# Patient Record
Sex: Female | Born: 1950 | Race: White | Hispanic: No | Smoking: Former smoker
Health system: Southern US, Community
[De-identification: ages and names within clinical notes are randomized; demographics above are authoritative.]

## PROBLEM LIST (undated history)

## (undated) DIAGNOSIS — H9190 Unspecified hearing loss, unspecified ear: Secondary | ICD-10-CM

## (undated) DIAGNOSIS — Z87442 Personal history of urinary calculi: Secondary | ICD-10-CM

## (undated) DIAGNOSIS — M797 Fibromyalgia: Secondary | ICD-10-CM

## (undated) DIAGNOSIS — U071 COVID-19: Secondary | ICD-10-CM

## (undated) DIAGNOSIS — I2510 Atherosclerotic heart disease of native coronary artery without angina pectoris: Secondary | ICD-10-CM

## (undated) DIAGNOSIS — M1990 Unspecified osteoarthritis, unspecified site: Secondary | ICD-10-CM

## (undated) DIAGNOSIS — J449 Chronic obstructive pulmonary disease, unspecified: Secondary | ICD-10-CM

## (undated) DIAGNOSIS — H539 Unspecified visual disturbance: Secondary | ICD-10-CM

## (undated) DIAGNOSIS — I214 Non-ST elevation (NSTEMI) myocardial infarction: Secondary | ICD-10-CM

## (undated) DIAGNOSIS — F419 Anxiety disorder, unspecified: Secondary | ICD-10-CM

## (undated) DIAGNOSIS — R0600 Dyspnea, unspecified: Secondary | ICD-10-CM

## (undated) DIAGNOSIS — I10 Essential (primary) hypertension: Secondary | ICD-10-CM

## (undated) DIAGNOSIS — Z8719 Personal history of other diseases of the digestive system: Secondary | ICD-10-CM

## (undated) DIAGNOSIS — E785 Hyperlipidemia, unspecified: Secondary | ICD-10-CM

## (undated) DIAGNOSIS — F32A Depression, unspecified: Secondary | ICD-10-CM

## (undated) HISTORY — DX: Atherosclerotic heart disease of native coronary artery without angina pectoris: I25.10

## (undated) HISTORY — PX: CHOLECYSTECTOMY: SHX55

## (undated) HISTORY — DX: Non-ST elevation (NSTEMI) myocardial infarction: I21.4

## (undated) HISTORY — DX: Hyperlipidemia, unspecified: E78.5

## (undated) HISTORY — PX: KNEE ARTHROSCOPY: SUR90

## (undated) HISTORY — PX: ABDOMINAL HYSTERECTOMY: SHX81

## (undated) HISTORY — DX: Unspecified hearing loss, unspecified ear: H91.90

## (undated) HISTORY — DX: Essential (primary) hypertension: I10

## (undated) HISTORY — PX: BACK SURGERY: SHX140

## (undated) HISTORY — PX: TUBAL LIGATION: SHX77

## (undated) HISTORY — PX: COLONOSCOPY: SHX174

## (undated) HISTORY — DX: Unspecified visual disturbance: H53.9

## (undated) HISTORY — PX: BREAST LUMPECTOMY: SHX2

---

## 1997-12-06 ENCOUNTER — Inpatient Hospital Stay (HOSPITAL_COMMUNITY): Admission: EM | Admit: 1997-12-06 | Discharge: 1997-12-08 | Payer: Self-pay | Admitting: Cardiology

## 1998-01-12 ENCOUNTER — Encounter: Payer: Self-pay | Admitting: Cardiology

## 2000-04-16 ENCOUNTER — Inpatient Hospital Stay (HOSPITAL_COMMUNITY): Admission: EM | Admit: 2000-04-16 | Discharge: 2000-04-18 | Payer: Self-pay | Admitting: Cardiology

## 2000-04-17 ENCOUNTER — Encounter: Payer: Self-pay | Admitting: Cardiology

## 2000-04-18 ENCOUNTER — Encounter: Payer: Self-pay | Admitting: Cardiology

## 2002-08-28 ENCOUNTER — Encounter: Payer: Self-pay | Admitting: Cardiology

## 2004-03-17 ENCOUNTER — Encounter: Payer: Self-pay | Admitting: Cardiology

## 2004-09-07 ENCOUNTER — Encounter: Payer: Self-pay | Admitting: Cardiology

## 2008-01-20 ENCOUNTER — Encounter: Payer: Self-pay | Admitting: Cardiology

## 2008-12-22 ENCOUNTER — Encounter: Payer: Self-pay | Admitting: Cardiology

## 2008-12-24 ENCOUNTER — Encounter: Payer: Self-pay | Admitting: Cardiology

## 2009-09-28 ENCOUNTER — Encounter: Payer: Self-pay | Admitting: Cardiology

## 2010-06-07 ENCOUNTER — Ambulatory Visit: Payer: Self-pay | Admitting: Cardiology

## 2010-06-07 DIAGNOSIS — E669 Obesity, unspecified: Secondary | ICD-10-CM

## 2010-06-07 DIAGNOSIS — F17200 Nicotine dependence, unspecified, uncomplicated: Secondary | ICD-10-CM | POA: Insufficient documentation

## 2010-06-07 DIAGNOSIS — E782 Mixed hyperlipidemia: Secondary | ICD-10-CM

## 2010-06-07 DIAGNOSIS — I2510 Atherosclerotic heart disease of native coronary artery without angina pectoris: Secondary | ICD-10-CM | POA: Insufficient documentation

## 2010-06-07 DIAGNOSIS — I2129 ST elevation (STEMI) myocardial infarction involving other sites: Secondary | ICD-10-CM

## 2010-08-23 NOTE — Letter (Signed)
Summary: [facility] -NOTE  [facility] -NOTE   Imported By: Claudette Laws 11/18/2009 13:18:11  _____________________________________________________________________  External Attachment:    Type:   Image     Comment:   External Document

## 2010-08-23 NOTE — Cardiovascular Report (Signed)
Summary: Cardiac Cath Other  Cardiac Cath Other   Imported By: Claudette Laws 11/18/2009 13:19:26  _____________________________________________________________________  External Attachment:    Type:   Image     Comment:   External Document

## 2010-08-23 NOTE — Assessment & Plan Note (Signed)
Summary: np6 transferring records -vs   Visit Type:  Initial Consult Primary Provider:  Linna Darner   History of Present Illness: the patient had a stress test in 2010 which was within normal limits. Chest prior history of PCI and stent placement her ejection fraction 65%. She had a stent obtuse marginal branch in May of 1999. She is now establish as a new patient transferring her care from [facility]. Now working again.  Lost a lot of weight. No scp. No sob. Pin needle feeling. See Hasanaj-bloodwork.  Preventive Screening-Counseling & Management  Alcohol-Tobacco     Smoking Status: current     Smoking Cessation Counseling: yes     Packs/Day: 1/2 PPD  Current Medications (verified): 1)  Prilosec Otc 20 Mg Tbec (Omeprazole Magnesium) .... Take 1 Tablet By Mouth Once A Day 2)  Metoprolol Tartrate 50 Mg Tabs (Metoprolol Tartrate) .... Take 1 Tablet By Mouth Once A Day At Bedtime 3)  Triglide 160 Mg Tabs (Fenofibrate) .... Take 1 Tablet By Mouth Once A Day 4)  Aspir-Low 81 Mg Tbec (Aspirin) .... Take 1 Tablet By Mouth Once A Day 5)  Fosamax 70 Mg Tabs (Alendronate Sodium) .... Take One By Mouth Weekly 6)  Lisinopril-Hydrochlorothiazide 10-12.5 Mg Tabs (Lisinopril-Hydrochlorothiazide) .... Take 2 Tablet By Mouth Once A Day 7)  Drisdol 16109 Unit Caps (Ergocalciferol) .... Take One By Mouth Weekly 8)  Meloxicam 15 Mg Tabs (Meloxicam) .... Take 1 Tablet By Mouth Once A Day As Needed 9)  Metamucil 0.52 Gm Caps (Psyllium) .... Take 1 Tablet By Mouth Once A Day 10)  Astepro 0.15 % Soln (Azelastine Hcl) .... 2 Sprays/nostrils Daily As Needed  Allergies (verified): No Known Drug Allergies  Comments:  Nurse/Medical Assistant: The patient's medication bottles and allergies were reviewed with the patient and were updated in the Medication and Allergy Lists.  Past History:  Past Surgical History: Last updated: 16-Jun-2010 Cardiac Catheterization  Family History: Last  updated: 06/16/2010 Father deceased Mother alive  Social History: Last updated: 2010-06-16 Married   Risk Factors: Smoking Status: current (06-16-2010) Packs/Day: 1/2 PPD (06-16-2010)  Past Medical History: HYPERLIPIDEMIA CHEST PAIN UNSPECIFIED  ACUT MYOCARD INFARCT OTH LAT WALL INIT EPIS CARE OBESITY, UNSPECIFIED NONDEPENDENT TOBACCO USE DISORDER coronary artery disease-post angioplasty to the obtuse marginal branch in May 1999 stress test December 22, 2008 breast attenuation artifact otherwise low risk with no ischemia ejection fraction 67% no EKG changes. Tobacco use  Social History: Smoking Status:  current Packs/Day:  1/2 PPD  Vital Signs:  Patient profile:   60 year old female Height:      69 inches Weight:      241 pounds BMI:     35.72 Pulse rate:   86 / minute BP sitting:   97 / 66  (left arm) Cuff size:   large  Vitals Entered By: Carlye Grippe (2010/06/16 3:06 PM)  Nutrition Counseling: Patient's BMI is greater than 25 and therefore counseled on weight management options.  Physical Exam  Additional Exam:  General: Well-developed, well-nourished in no distress head: Normocephalic and atraumatic eyes PERRLA/EOMI intact, conjunctiva and lids normal nose: No deformity or lesions mouth normal dentition, normal posterior pharynx neck: Supple, no JVD.  No masses, thyromegaly or abnormal cervical nodes lungs: Normal breath sounds bilaterally without wheezing.  Normal percussion heart: regular rate and rhythm with normal S1 and S2, no S3 or S4.  PMI is normal.  No pathological murmurs abdomen: Normal bowel sounds, abdomen is soft and nontender  without masses, organomegaly or hernias noted.  No hepatosplenomegaly musculoskeletal: Back normal, normal gait muscle strength and tone normal pulsus: Pulse is normal in all 4 extremities Extremities: No peripheral pitting edema neurologic: Alert and oriented x 3 skin: Intact without lesions or rashes cervical  nodes: No significant adenopathy psychologic: Normal affect    EKG  Procedure date:  07/06/2010  Findings:      EKG normal sinus rhythm no acute ischemic changes  Impression & Recommendations:  Problem # 1:  HYPERLIPIDEMIA (ICD-272.4) continue medical therapy follow the panel for primary care physician Her updated medication list for this problem includes:    Triglide 160 Mg Tabs (Fenofibrate) .Marland Kitchen... Take 1 tablet by mouth once a day  Problem # 2:  ACUT MYOCARD INFARCT OTH LAT WALL INIT EPIS CARE (ICD-410.51) no recurrent chest pain.  Continue medical therapy status post prior angioplasty Her updated medication list for this problem includes:    Metoprolol Tartrate 50 Mg Tabs (Metoprolol tartrate) .Marland Kitchen... Take 1 tablet by mouth once a day at bedtime    Aspir-low 81 Mg Tbec (Aspirin) .Marland Kitchen... Take 1 tablet by mouth once a day    Lisinopril-hydrochlorothiazide 10-12.5 Mg Tabs (Lisinopril-hydrochlorothiazide) .Marland Kitchen... Take 2 tablet by mouth once a day  Problem # 3:  OBESITY, UNSPECIFIED (ICD-278.00) weight loss.  Patient is dieting  Problem # 4:  NONDEPENDENT TOBACCO USE DISORDER (ICD-305.1) I counseled the patient regarding her tobacco use.  Patient Instructions: 1)  Your physician recommends that you continue on your current medications as directed. Please refer to the Current Medication list given to you today. 2)  Follow up in  6 months

## 2010-08-23 NOTE — Letter (Signed)
Summary: [facility] -OFFICE NOTE  [facility] -OFFICE NOTE   Imported By: Claudette Laws 11/18/2009 10:35:12  _____________________________________________________________________  External Attachment:    Type:   Image     Comment:   External Document

## 2010-12-09 NOTE — Discharge Summary (Signed)
[place]  Patient:    Audrey Craig, Audrey Craig                        MRN: [id number] Adm. Date:  [id number] Disc. Date: [id number] Attending:  Ophelia Shoulder Dictator:   [facility] [place], [place].A.                           Discharge Summary  ADMISSION DIAGNOSES:  1. Chest pain/not otherwise specified.  2. History of coronary artery disease with history of angioplasty.  3. 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  4. Hyperlipidemia.  5. Postmenopausal.  6. History of back surgery.  7. History of hysterectomy.  8. History of cholecystectomy.  9. History of lumpectomy that was benign.  DISCHARGE DIAGNOSES:  1. Chest/pain not otherwise specified.  2. History of coronary artery disease with history of angioplasty.  3. 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  4. Hyperlipidemia.  5. Postmenopausal.  6. History of back surgery.  7. History of hysterectomy.  8. History of cholecystectomy.  9. History of lumpectomy that was benign. 10. Status post cardiac catheterization April 18, 2000 with patent     coronary arteries on this catheterization. 11. Status post adenosine-cardiolite April 17, 2000 that revealed positive     ischemia in the anterior and apex region.  HISTORY OF PRESENT ILLNESS:  [name] is a 60 year old white female with a history of subendocardial myocardial infarction with one vessel CAD at the time of catheterization in May 1999.  She underwent PTCA stent to the circumflex at that time.  As well she has a history of hyperlipidemia and occasional tobacco use.  She presented to [facility] on April 16, 2000 as a transfer from [facility] with complaints of chest pain.  She stated that on the previous day while getting ready for church she developed chest pain in the middle of her chest "like somebody stepping on me."  It was a "pressure."  There was no radiation of the pain, no  shortness of breath, and no diaphoresis.  She stated that it lasted approximately 15 minutes and spontaneously resolved on its own.  She went to church and went on with her daily activities.  After church they went to a restaurant for lunch.  She had just taken a few sips of water when she developed chest pain. It was a pressure and squeezing sensation across her chest.  She did develop associated shortness of breath, diaphoresis, and radiation to the back.  As well she had nausea but no emesis.  At that time she got up and went to the bathroom and put a cold rag on her face and loosened her bra as it felt as if this was too tight.  She then went back and her family took her to the emergency room of [facility].  She was given oxygen and nitroglycerin with relief after approximately 15 to 30 minutes.  She had no return of the symptoms since.  On exam at that time, her blood pressure was 129/80, heart rate 82, and her exam essentially benign.  EKG from [facility] had shown normal sinus rhythm 78 beats per minute.  She did have some new T wave inversions in lead 3, nonspecific change in aVF that was new since EKG of April 29, 1999, but these were nonspecific changes.  As well, initial cardiac enzymes from [facility] had  been negative.  At that time, Ms. Tackitt was seen by Dr. Lavonne Chick.  We planned to continue home medications including aspirin and Zocor.  We would give nitroglycerin 0.4 mg sublingual as needed for chest pain.  She was planned for adenosine-cardiolite the following morning.  HOSPITAL COURSE:  On the morning of April 17, 2000, Ms. Heney was hemodynamically stable with no further chest pain.  Enzymes continued to be negative and exam remained benign.  She was planned for cardiolite that day. Later that day, she underwent adenosine-cardiolite.  During infusion patient had complaints of shortness of breath, flushing, and complaints of pressure in her chest.   This resolved two minutes after the test was completed.  EKG showed no significant arrhythmias or ST changes.  There continued to be poor R wave progression which was then changed from baseline.  The cardiolite images ended up revealing positive ischemia in the anterior and apex.  Therefore, she was planned for cardiac catheterization with Dr. Jenne Campus on April 18, 2000.  On April 18, 2000, Ms. Hirschhorn underwent cardiac catheterization by Dr. Lenise Herald.  She was found to have spasm in the proximal RCA. Otherwise, all coronaries were patent.  All previous stent sites, PCI sites were patent.  He found no significant CAD.  LV gram showed EF 60%.  She had no renal artery stenosis and her distal aorta was okay.  She was planned for medical therapy and for discharge home after bed rest if her groin remained stable.  HOSPITAL CONSULTATIONS:  None.  HOSPITAL PROCEDURES: 1. Adenosine-cardiolite on April 17, 2000 that revealed positive    ischemia in the anterior and apex regions. 2. Status post cardiac catheterization by Dr. Lenise Herald on April 18, 2000 revealing normal coronary arteries and normal LV function.  HOSPITAL LABORATORIES:  INR was 1.0 at admission.  CBC showed WBC 9.3, hemoglobin 14.3, hematocrit 40.1, platelets 201.  Metabolic profile showed sodium 138, potassium 3.8, glucose 81, BUN 11, creatinine 0.8.  Cardiac enzymes were negative with CKs of 57, 50, and 50.  MBs were 0.4 and 0.3. Troponin of less than 0.03 x 2.  Magnesium normal at 2.1.  TSH normal at 1.153.  Lipid profile shows cholesterol 196, triglyceride 255, HDL 31, LDL 114.  Cardiolite on April 17, 2000 revealed: (1) Ischemia involving the anterior apical/aspect of the ______ wall.  (2) ______ attenuation artifact.  (3) No wall motion abnormality.  (4) Normal LV function with EF 65%.  EKG on admission revealed a normal sinus rhythm 78 beats per minute.  There was T wave inversion in lead  3 and nonspecific ST changes in aVF which were different from EKG of April 28, 1999 but were nonspecific changes.  DISCHARGE MEDICATIONS:  1. Enteric-coated aspirin 325 mg one p.o. q.d. 2. Zocor 80 mg one p.o. q.h.s. 3. Premarin 0.625 mg one p.o. q.d. 4. Calcium + 600 mg one p.o. q.d. 5. Vitamin E 400 international units one q.d. 6. Centrum Silver one q.d.  DISCHARGE ACTIVITY:  No strenuous activity, lifting greater than five pounds, driving, or sexual activity for three days.  DIET:  Low-salt, low-fat diet.  WOUND CARE:  Patient may wash her groin with warm water and soap.  DISCHARGE INSTRUCTIONS:  Call the office at [phone] if any bleeding or increased signs of pain in the groin.  FOLLOW-UP:  In approximately two months, she needs to make sure that she has labs drawn to check her fasting blood profile and liver function tests  to follow up her increased Lipitor/Zocor dose.  She has an appointment to follow up in the [place] office with Dr. Elsie Lincoln on May 16, 2000 at 10:15 a.m. DD:  05/11/00 TD:  05/12/00 Job: [id number]

## 2010-12-09 NOTE — Discharge Summary (Signed)
[place]. [facility]  Patient:    Audrey, Craig                        MRN: [id number] Adm. Date:  [id number] Disc. Date: 04/18/00 Attending:  Ophelia Shoulder Dictator:   [facility] [place], [place].A.                           Discharge Summary  ADMISSION DIAGNOSES:  1. Unstable angina.  2. 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  3. Status post cardiac catheterization May 1999 revealing one-vessel coronary     artery disease with percutaneous transluminal coronary angioplasty stent     to the circumflex at that time.  All other coronaries were patent and     normal at that time of catheterization.  4. Normal left ventricular function at time of catheterization in May 1999.  5. Hyperlipidemia.  6. Postmenopausal.  7. History of back surgery.  8. History of hysterectomy.  9. History of cholecystectomy. 10. History of lumpectomy that was benign.  DISCHARGE DIAGNOSES:  1. Unstable angina.  2. 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  3. Status post cardiac catheterization May 1999 revealing one-vessel coronary     artery disease with percutaneous transluminal coronary angioplasty stent     to the circumflex at that time.  All other coronaries were patent and     normal at that time of catheterization.  4. Normal left ventricular function at time of catheterization in May 1999.  5. Hyperlipidemia.  6. Postmenopausal.  7. History of back surgery.  8. History of hysterectomy.  9. History of cholecystectomy. 10. History of lumpectomy that was benign. 11. Status post cardiac catheterization on April 18, 2000 with patent     coronary arteries, normal left ventricular function, ejection fraction     60%.  No renal artery stenosis.  HISTORY OF PRESENT ILLNESS:  Audrey Craig is a 60 year old white female with a history of subendocardial myocardial infarction with one-vessel CAD at time of catheterization in May 1999.  She  underwent PTCA stent of the circumflex at that time.  As well, she has a history of hyperlipidemia and occasional tobacco use.  She presented on April 16, 2000 as a transfer from [facility] with complaints of chest pain.  She states that on the previous day, while getting ready for church, she developed chest pain in the middle of her chest "like somebody stepping on me."  It was a "pressure."  There was no radiation of the pain, no shortness of breath, and no diaphoresis.  She states that it lasted approximately 15 minutes then spontaneously resolved on its own.  She went to church and went on with her daily activities.  After church, they went to a restaurant for lunch.  She had just taken few sips of water when she developed chest pain.  It was a pressure and squeezing sensation across her chest.  She did develop associated shortness of breath, diaphoresis, and radiation to the back.  As well, she had nausea but no emesis.  At that time, she got up and went to the bathroom and put a cold rag on her face and loosened her bra as it felt as if this was too tight.  She then went back and her family took her to the emergency room of [facility].  She was given oxygen and nitroglycerin with relief after  approximately 15-30 minutes.  She had had no return of her symptoms since.  On exam at that time, her blood pressure was 129/80, heart rate 82, and her exam essentially benign.  EKG from [facility] showed normal sinus rhythm 78 beats per minute.  She did have some new T wave inversions in lead 3 and nonspecific change in aVF that was new since EKG of April 28, 1999, though these were nonspecific changes.  As well, initial cardiac enzymes from [facility] have been negative.  At that time, Audrey Craig was seen by Dr. Lavonne Chick.  We planned to continue home medications including aspirin and Zocor.  We would give nitroglycerin 0.4 mg sublingual as needed for chest pain.   She was planned for adenosine- cardiolite the following morning.  HOSPITAL COURSE:  On the morning of April 17, 2000, Audrey Craig was hemodynamically stable with no further chest pain.  Enzymes continued to be negative and exam remained benign.  She was planned for cardiolite that day. Later that day, she underwent adenosine-cardiolite.  During infusion, patient had complaints of shortness of breath, flushing, and complaints of pressure in her chest.  This resolved two minutes after the test was completed.  EKG showed no significant arrhythmias or ST changes.  There continued to be poor R-wave progression which was unchanged from baseline.  The cardiolite images ended up revealing positive ischemia in the anterior and apex.  Therefore, she was then planned for cardiac catheterization with Dr. Jenne Campus on April 18, 2000.  On April 18, 2000, Audrey Craig underwent cardiac catheterization by Dr. Lenise Herald.  She was found to have some spasm in the proximal RCA. Otherwise, all coronaries were patent.  All previous stent sites, PCI site was patent.  He found no significant CAV.  LV gram showed EF 60%.  She had no renal artery stenosis and her distal aorta was okay.  She was planned for medical therapy and for discharge home after bed rest if her groin remained stable.  HOSPITAL CONSULTATIONS:  None.  HOSPITAL PROCEDURES: 1. Adenosine-cardiolite on April 17, 2000 that revealed positive ischemia    in the anterior and apex regions. 2. Status post cardiac catheterization by Dr. Lenise Herald on    April 18, 2000 revealing normal coronary arteries and normal LV    function.  HOSPITAL LABORATORIES:  INR was 1.0.  CBC showed WBC 9.3, hemoglobin 14.3, hematocrit 40.1, platelets 201.  Metabolic profile showed sodium 138, potassium 3.8, glucose 81, BUN 11, creatinine 0.8.  Cardiac enzymes were negative with CKs of 57 and 50, MB 0.4/0.3, troponin less than 0.03 x 2. Magnesium  normal at 2.1.  TSH normal at 1.153.  Lipid profile revealed Total cholesterol 196, triglycerides 255, HDL 31, LDL 114.   Cardiolite on April 17, 2000 revealed:  (1) Ischemia involving the interior apical/aspect of the ______ wall.  (2) Breast attenuation artifact. (3) No wall motion abnormality.  (4) Normal LV function, EF 65%.  EKG on admission revealed a normal sinus rhythm 78 beats per minute.  There was T wave inversion in lead 3 and nonspecific ST changes in aVF which were different from EKG of April 28, 1999 but were nonspecific changes.  DISCHARGE MEDICATIONS: 1. Enteric-coated aspirin 325 mg one p.o. q.d. 2. Zocor 80 mg one p.o. q.h.s. (this was increased from her previous dose of    40 mg a day due to her dyslipidemia). 3. Premarin 0.625 mg one p.o. q.d. 4. Calcium Plus 600 mg one q.d. 5.  Vitamin E 400 international units one q.d. 6. Centrum Silver one q.d.  DISCHARGE ACTIVITIES:  No strenuous activity, lifting greater than five pounds, driving, or sexual activity for three days.  DIET:  Low-salt, low-fat diet.  WOUND CARE:  Patient may wash her groin with warm water and soap.  Call the office at [phone] if any bleeding or increased signs of pain in her groin.  FOLLOW-UP:  In approximately two months, she needs to make sure she has labs drawn to check a fasting lipid profile and liver function tests for follow up of increased Zocor dose.  She has an appointment to follow up in the [place] office with Dr. Elsie Lincoln on May 16, 2000 at 10:15 a.m. DD:  04/18/00 TD:  04/19/00 Job: [id number]

## 2010-12-09 NOTE — Cardiovascular Report (Signed)
[place]. [facility]  Patient:    Audrey Craig, Audrey Craig                        MRN: [id number] Proc. Date: 04/18/00 Adm. Date:  [id number] Attending:  Ophelia Shoulder CC:         Madaline Savage, M.D.   Cardiac Catheterization  PROCEDURE: 1. Left heart catheterization. 2. Coronary angiography. 3. Left ventriculogram. 4. Descending aortogram.  COMPLICATIONS:  None.  INDICATIONS:  Ms. Loose is a 60 year old white female patient of Madaline Savage, M.D. with a history of CAD status post PTCA in May of 1999 of a third OM.  The patient also has a history of hyperlipidemia and tobacco abuse.  She was admitted on April 16, 2000, with substernal chest pain and subsequently ruled out for myocardial infarction.  She underwent Adenosine Cardiolite while hospitalized which revealed distal anterior apical ischemia and is now referred for cardiac catheterization to define her coronary anatomy.  DESCRIPTION OF PROCEDURE:  After giving informed written consent, the patient is brought to the cardiac catheterization and her right and left groins were shaved, prepped and draped in the usual sterile fashion.  ECG monitoring was established.  Using modified Seldinger technique, a #6 French arterial sheath was inserted in the right femoral artery.  6 French diagnostic catheter was then used to perform diagnostic angiography.  This reveals a large left main with no significant disease.  The LAD is a large vessel which coursed the apex and gave rise to two diagonal branches.  LAD had no significant disease.  The first and second diagonals are medium size vessels with no significant disease.  Left coronary artery also gives rise to medium size ramus intermedius which bifurcates in its distal portion and has no significant disease.  The left circumflex is a medium size which coursed in the AV groove and gave rise to two obtuse marginal branches.  AV groove  circumflex has no significant disease.  The first OM is a medium size vessel which bifurcates distally and has no significant disease.  The second OM is a small vessel with no significant disease.  The right coronary artery is a large vessel which is dominant and gives rise to both the PDA as well as the posterolateral branch.  The RCA is noted to have some spasm in its proximal segment which is relieved with intracoronary nitroglycerin.  There is no significant disease in the remainder of the RCA. Both the PDA as well as the posterolateral branch have no significant disease.  Left ventriculogram reveals a significant ectope with preserved EF, estimated at 60%.  Descending aortogram reveals no evidence of renal artery stenosis with normal distal aorta and iliac system.  HEMODYNAMICS:  Systemic arterial pressure 105/70, LV systemic pressure 105/10, LV EDP 14.  CONCLUSION: 1. No significant CAD. 2. Normal left ventricular systolic function. 3. No evidence of renal artery stenosis. DD:  04/18/00 TD:  04/18/00 Job: [id number]

## 2011-01-05 ENCOUNTER — Encounter: Payer: Self-pay | Admitting: Cardiology

## 2011-01-18 ENCOUNTER — Ambulatory Visit: Payer: Self-pay | Admitting: Cardiology

## 2011-02-15 ENCOUNTER — Ambulatory Visit: Payer: Self-pay | Admitting: Cardiology

## 2011-02-23 ENCOUNTER — Encounter: Payer: Self-pay | Admitting: Cardiology

## 2011-02-23 ENCOUNTER — Ambulatory Visit (INDEPENDENT_AMBULATORY_CARE_PROVIDER_SITE_OTHER): Payer: BC Managed Care – PPO | Admitting: Cardiology

## 2011-02-23 VITALS — BP 105/68 | HR 80 | Ht 69.0 in | Wt 247.0 lb

## 2011-02-23 DIAGNOSIS — I2129 ST elevation (STEMI) myocardial infarction involving other sites: Secondary | ICD-10-CM

## 2011-02-23 DIAGNOSIS — E785 Hyperlipidemia, unspecified: Secondary | ICD-10-CM

## 2011-02-23 MED ORDER — NITROGLYCERIN 0.4 MG SL SUBL: 0.4000 mg | SUBLINGUAL_TABLET | SUBLINGUAL | Status: DC | PRN

## 2011-02-23 NOTE — Patient Instructions (Signed)
Continue all current medications. Your physician wants you to follow up in: 6 months.  You will receive a reminder letter in the mail one-two months in advance.  If you don't receive a letter, please call our office to schedule the follow up appointment   

## 2011-07-03 NOTE — Assessment & Plan Note (Signed)
No recurrent scp. No ischemia work- up.

## 2011-07-03 NOTE — Assessment & Plan Note (Signed)
F/U with LMD.

## 2011-07-03 NOTE — Progress Notes (Signed)
Peyton Bottoms, MD, [facility] ABIM Board Certified in Adult Cardiovascular Medicine,Internal Medicine and Critical Care Medicine    CC: f/u CAD  HPI:  the patient had a stress test in 2010 which was within normal limits. Chest prior history of PCI and stent placement her ejection fraction 65%. She had a stent obtuse marginal branch in May of 1999. She is now establish as a new patient transferring her care from [facility]. Now working again.  Lost a lot of weight. No scp. No sob. Pin needle feeling. See Hasanaj-bloodwork.      PMH: reviewed and listed in Problem List in Electronic Records (and see below) Past Medical History  Diagnosis Date  . Hyperlipidemia   . Chest pain, unspecified   . Myocardial infarction     acute, OTH LAT WALL INIT EPIS CARE  . Tobacco use disorder     nondependent  . Obesity   . CAD (coronary artery disease)     post angioplasty to the obtuse marginal branch in May 1999, stress test December 22, 2008 breast attenuation artifact otherwise low risk with no ischemia ejection fraction 67% no EKG changes     Allergies/SH/FHX : available in Electronic Records for review  Medications: Current Outpatient Prescriptions  Medication Sig Dispense Refill  . albuterol (PROAIR HFA) 108 (90 BASE) MCG/ACT inhaler Inhale 2 puffs into the lungs every 4 (four) hours as needed.        Marland Kitchen alendronate (FOSAMAX) 70 MG tablet Take 70 mg by mouth every 7 (seven) days. Take with a full glass of water on an empty stomach.       Marland Kitchen aspirin 81 MG tablet Take 81 mg by mouth daily.       . Calcium Carbonate (CALTRATE 600) 1500 MG TABS Take 1 tablet by mouth 2 (two) times daily.        . ergocalciferol (VITAMIN D2) 50000 UNITS capsule Take 50,000 Units by mouth once a week.        . fenofibrate 160 MG tablet Take 160 mg by mouth daily.        . fish oil-omega-3 fatty acids 1000 MG capsule Take 2 g by mouth daily.        Marland Kitchen lisinopril-hydrochlorothiazide  (PRINZIDE,ZESTORETIC) 10-12.5 MG per tablet Take 1 tablet by mouth daily.       . metoprolol (LOPRESSOR) 50 MG tablet Take 50 mg by mouth 2 (two) times daily.       . Naproxen Sodium (ALEVE) 220 MG CAPS Take 2 capsules by mouth 2 (two) times daily.        Marland Kitchen omeprazole (PRILOSEC) 20 MG capsule Take 20 mg by mouth daily.        . psyllium (REGULOID) 0.52 G capsule Take 0.52 g by mouth daily.        . nitroGLYCERIN (NITROSTAT) 0.4 MG SL tablet Place 1 tablet (0.4 mg total) under the tongue every 5 (five) minutes as needed for chest pain.  25 tablet  3    ROS: No nausea or vomiting. No fever or chills.No melena or hematochezia.No bleeding.No claudication  Physical Exam: BP 105/68  Pulse 80  Ht 5\' 9"  (1.753 m)  Wt 247 lb (112.038 kg)  BMI 36.48 kg/m2 General: well- nourished. No distress HEENT: normal carotid upstroke. Normal JVP. No thyromegaly Cardiac: RRR, NL S1/S2. No pathologic murmurs Lungs: clear BS bilaterally, no wheezing. Abdomen, soft, non- tender Extremities. No edema. Normal distal pulses Skin: warm and dry Physologic ;  normal affect.    12lead ECG: Limited bedside ECHO:N/A   Assessment and Plan

## 2012-05-28 ENCOUNTER — Encounter: Payer: Self-pay | Admitting: Cardiology

## 2012-05-28 ENCOUNTER — Other Ambulatory Visit: Payer: Self-pay | Admitting: Cardiology

## 2012-05-28 ENCOUNTER — Ambulatory Visit (INDEPENDENT_AMBULATORY_CARE_PROVIDER_SITE_OTHER): Payer: BC Managed Care – PPO | Admitting: Cardiology

## 2012-05-28 ENCOUNTER — Encounter: Payer: Self-pay | Admitting: *Deleted

## 2012-05-28 ENCOUNTER — Telehealth: Payer: Self-pay | Admitting: Cardiology

## 2012-05-28 VITALS — BP 99/66 | HR 70 | Ht 69.0 in | Wt 243.1 lb

## 2012-05-28 DIAGNOSIS — I252 Old myocardial infarction: Secondary | ICD-10-CM

## 2012-05-28 DIAGNOSIS — I2510 Atherosclerotic heart disease of native coronary artery without angina pectoris: Secondary | ICD-10-CM

## 2012-05-28 DIAGNOSIS — E785 Hyperlipidemia, unspecified: Secondary | ICD-10-CM

## 2012-05-28 DIAGNOSIS — R0602 Shortness of breath: Secondary | ICD-10-CM

## 2012-05-28 MED ORDER — NITROGLYCERIN 0.4 MG SL SUBL: 0.4000 mg | SUBLINGUAL_TABLET | SUBLINGUAL | Status: AC | PRN

## 2012-05-28 NOTE — Assessment & Plan Note (Signed)
History of PTCA to the obtuse marginal in the setting of infarct back in 1999. Followup ECG is reviewed and nonspecific. She has had no ischemic evaluation over the last 3 or 4 years, and a followup Lexiscan cardiolite will be arranged on medical therapy for ischemic surveillance. If stable, anticipate followup in a year.

## 2012-05-28 NOTE — Patient Instructions (Addendum)
Your physician recommends that you schedule a follow-up appointment in: 1 year. You will receive a reminder letter in the mail in about 10 months reminding you to call and schedule your appointment. If you don't receive this letter, please contact our office. Your physician recommends that you continue on your current medications as directed. Please refer to the Current Medication list given to you today. Your physician has requested that you have a lexiscan myoview. For further information please visit [email or web address]. Please follow instruction sheet, as given.  

## 2012-05-28 NOTE — Telephone Encounter (Signed)
Pt has Audrey Craig of [place].  Per Toma Copier, [phone], JWJX#[id number], valid 11-8 to 06-14-12

## 2012-05-28 NOTE — Progress Notes (Signed)
Clinical Summary Audrey Craig is a 61 y.o.female presenting for followup. She is a former patient of Dr. Andee Craig, prefers to stay with Audrey Craig. She was last seen in August 2012.  She reports no clear cut angina symptoms. Has a variety of other complaints including anxiety with panic attacks, chronic knee and back pain, fibromyalgia with muscle spasms.  Lab work from March of this year reviewed finding BUN 24, creatinine 0.76, AST 21, ALT 25, potassium 3.5. She reports lipid followup with Audrey Craig.  She has had no followup stress testing within the last 3 or 4 years. Previous cardiac history detailed below.   No Known Allergies  Current Outpatient Prescriptions  Medication Sig Dispense Refill  . albuterol (PROAIR HFA) 108 (90 BASE) MCG/ACT inhaler Inhale 2 puffs into the lungs every 4 (four) hours as needed.        Marland Kitchen aspirin 81 MG tablet Take 81 mg by mouth daily.       . Calcium Carbonate (CALTRATE 600) 1500 MG TABS Take 1 tablet by mouth daily.       . DULoxetine (CYMBALTA) 20 MG capsule Take 20 mg by mouth 2 (two) times daily.      . ergocalciferol (VITAMIN D2) 50000 UNITS capsule Take 50,000 Units by mouth once a week.        . fish oil-omega-3 fatty acids 1000 MG capsule Take 1 g by mouth daily.       Marland Kitchen lisinopril-hydrochlorothiazide (PRINZIDE,ZESTORETIC) 10-12.5 MG per tablet Take 1 tablet by mouth daily.       . metoprolol (LOPRESSOR) 50 MG tablet Take 50 mg by mouth 2 (two) times daily.       . Multiple Vitamins-Minerals (SENIOR MULTIVITAMIN PLUS PO) Take 1 tablet by mouth daily.      Marland Kitchen omeprazole (PRILOSEC) 20 MG capsule Take 20 mg by mouth daily.        . psyllium (METAMUCIL) 0.52 G capsule Take 0.52 g by mouth daily.      Marland Kitchen acetaminophen (TYLENOL) 500 MG tablet Take 500 mg by mouth as needed.      . nitroGLYCERIN (NITROSTAT) 0.4 MG SL tablet Place 1 tablet (0.4 mg total) under the tongue every 5 (five) minutes as needed. Up to 3 doses. If no relief after 3rd dose, proceed to ED   25 tablet  3  . [DISCONTINUED] nitroGLYCERIN (NITROSTAT) 0.4 MG SL tablet Place 1 tablet (0.4 mg total) under the tongue every 5 (five) minutes as needed for chest pain.  25 tablet  3  . [DISCONTINUED] nitroGLYCERIN (NITROSTAT) 0.4 MG SL tablet Place 0.4 mg under the tongue every 5 (five) minutes as needed.        Past Medical History  Diagnosis Date  . Hyperlipidemia   . NSTEMI (non-ST elevated myocardial infarction)     1999  . Coronary atherosclerosis of native coronary artery     PCTA obtuse marginal 5/99 - previous [facility] patient    Social History Audrey Craig reports that she has quit smoking. Her smoking use included Cigarettes. She started smoking 2 days ago. She has a 20 pack-year smoking history. She has never used smokeless tobacco. Audrey Craig reports that she does not drink alcohol.  Review of Systems No palpitations or syncope. Uses cane to ambulate. No falls. No reported bleeding episodes. Otherwise negative except as outlined.  Physical Examination Filed Vitals:   05/28/12 1351  BP: 99/66  Pulse: 70   Filed Weights   05/28/12 1351  Weight: 243 lb  1.9 oz (110.279 kg)   Patient no acute distress. HEENT: Conjunctiva and lids normal, oropharynx clear. Neck: Supple, no elevated JVP or carotid bruits, no thyromegaly. Lungs: Clear to auscultation, nonlabored breathing at rest. Cardiac: Regular rate and rhythm, no S3 or significant systolic murmur, no pericardial rub. Abdomen: Soft, nontender, bowel sounds present. Extremities: Trace edema, distal pulses 2+. Skin: Warm and dry. Musculoskeletal: No kyphosis. Neuropsychiatric: Alert and oriented x3, affect grossly appropriate.   Problem List and Plan   Coronary atherosclerosis of native coronary artery History of PTCA to the obtuse marginal in the setting of infarct back in 1999. Followup ECG is reviewed and nonspecific. She has had no ischemic evaluation over the last 3 or 4 years, and a followup Lexiscan cardiolite  will be arranged on medical therapy for ischemic surveillance. If stable, anticipate followup in a year.  HYPERLIPIDEMIA Hopefully to obtain recent lab work from Audrey Craig. She states she was taken off of statin therapy previously, has been omega-3 supplements.    Audrey Craig, M.D., F.A.C.C.

## 2012-05-28 NOTE — Telephone Encounter (Signed)
lexiscan myoview scheduled for 05-31-2012

## 2012-05-28 NOTE — Assessment & Plan Note (Signed)
Hopefully to obtain recent lab work from Dr. Olena Leatherwood. She states she was taken off of statin therapy previously, has been omega-3 supplements.

## 2012-06-06 ENCOUNTER — Telehealth: Payer: Self-pay | Admitting: *Deleted

## 2012-06-06 NOTE — Telephone Encounter (Signed)
Husband informed

## 2012-06-06 NOTE — Telephone Encounter (Signed)
Message copied by Eustace Cutshaw on Thu Jun 06, 2012 10:44 AM ------      Message from: MCDOWELL, Illene Bolus      Created: Wed Jun 05, 2012  8:38 AM       Please let her know that the Cardiolite was normal, shows no evidence of ischemia or obvious evidence of progressive CAD. Continue medical therapy.

## 2014-05-26 ENCOUNTER — Ambulatory Visit (INDEPENDENT_AMBULATORY_CARE_PROVIDER_SITE_OTHER): Payer: Medicare Other | Admitting: Cardiology

## 2014-05-26 ENCOUNTER — Encounter: Payer: Self-pay | Admitting: Cardiology

## 2014-05-26 VITALS — BP 115/78 | HR 69 | Ht 69.0 in | Wt 234.0 lb

## 2014-05-26 DIAGNOSIS — E782 Mixed hyperlipidemia: Secondary | ICD-10-CM

## 2014-05-26 DIAGNOSIS — I2510 Atherosclerotic heart disease of native coronary artery without angina pectoris: Secondary | ICD-10-CM

## 2014-05-26 NOTE — Patient Instructions (Signed)

## 2014-05-26 NOTE — Assessment & Plan Note (Signed)
Currently on omega-3 supplements, requesting lab work for review.

## 2014-05-26 NOTE — Progress Notes (Signed)
Reason for visit: CAD, hyperlipidemia  Clinical Summary Ms. Briski is a 63 y.o.female last seen in November 2013. She is here with her mother today. She tells me that her husband passed away since I last saw her in clinic. She has had no angina symptoms. Has not been exercising, in fact gained about 18 pounds over the last year. We reviewed her medications, she continues on aspirin, beta blocker, ACE inhibitor,and omega-3 supplements. She reports having lab work with Dr. Sherrie Sport within the last few months, results to be requested.  Lexiscan Cardiolite in November 2013 was negative for ischemia with LVEF greater than 70%.I reviewed this with her again today.   No Known Allergies  Current Outpatient Prescriptions  Medication Sig Dispense Refill  . acetaminophen (TYLENOL) 500 MG tablet Take 500 mg by mouth as needed.    Marland Kitchen albuterol (PROAIR HFA) 108 (90 BASE) MCG/ACT inhaler Inhale 2 puffs into the lungs every 4 (four) hours as needed.      Marland Kitchen aspirin 81 MG tablet Take 81 mg by mouth daily.     . Calcium Carbonate (CALTRATE 600) 1500 MG TABS Take 1 tablet by mouth daily.     . diclofenac (VOLTAREN) 75 MG EC tablet Take 1 tablet by mouth 2 (two) times daily.    . DULoxetine (CYMBALTA) 60 MG capsule Take 1 capsule by mouth 2 (two) times daily.    . ergocalciferol (VITAMIN D2) 50000 UNITS capsule Take 50,000 Units by mouth once a week.      . fish oil-omega-3 fatty acids 1000 MG capsule Take 3 g by mouth daily.     Marland Kitchen lisinopril-hydrochlorothiazide (PRINZIDE,ZESTORETIC) 10-12.5 MG per tablet Take 1 tablet by mouth daily.     . metoprolol (LOPRESSOR) 50 MG tablet Take 50 mg by mouth 2 (two) times daily.     . Multiple Vitamins-Minerals (SENIOR MULTIVITAMIN PLUS PO) Take 1 tablet by mouth daily.    . nitroGLYCERIN (NITROSTAT) 0.4 MG SL tablet Place 1 tablet (0.4 mg total) under the tongue every 5 (five) minutes as needed. Up to 3 doses. If no relief after 3rd dose, proceed to ED 25 tablet 3  .  psyllium (METAMUCIL) 0.52 G capsule Take 0.52 g by mouth daily.     No current facility-administered medications for this visit.    Past Medical History  Diagnosis Date  . Hyperlipidemia   . NSTEMI (non-ST elevated myocardial infarction)     1999  . Coronary atherosclerosis of native coronary artery     PCTA obtuse marginal 5/99 - previous [place] patient    Social History Ms. Tritch reports that she has quit smoking. Her smoking use included Cigarettes. She started smoking about 2 years ago. She has a 20 pack-year smoking history. She has never used smokeless tobacco. Ms. Winner reports that she does not drink alcohol.  Review of Systems Complete review of systems negative except as otherwise outlined in the clinical summary. She has neuropathic sounding discomfort in her arms and right shoulder happens intermittently. Also in her right thigh area. No claudication symptoms.  Physical Examination Filed Vitals:   05/26/14 1441  BP: 115/78  Pulse: 69   Filed Weights   05/26/14 1441  Weight: 234 lb (106.142 kg)    Patient no acute distress. HEENT: Conjunctiva and lids normal, oropharynx clear. Neck: Supple, no elevated JVP or carotid bruits, no thyromegaly. Lungs: Clear to auscultation, nonlabored breathing at rest. Cardiac: Regular rate and rhythm, no S3 or significant systolic murmur, no pericardial rub.  Abdomen: Soft, nontender, bowel sounds present. Extremities: Trace edema, distal pulses 2+. Skin: Warm and dry. Musculoskeletal: No kyphosis. Neuropsychiatric: Alert and oriented x3, affect grossly appropriate.   Problem List and Plan   Coronary atherosclerosis of native coronary artery Symptomatically stable on medical therapy as outlined above. She had reassuring ischemic testing within the last 2 years. Will request recent lab work from Dr. Sherrie Sport, otherwise continue observation and annual follow-up.  Mixed hyperlipidemia Currently on omega-3 supplements, requesting  lab work for review.    Satira Sark, M.D., F.A.C.C.

## 2014-05-26 NOTE — Assessment & Plan Note (Signed)
Symptomatically stable on medical therapy as outlined above. She had reassuring ischemic testing within the last 2 years. Will request recent lab work from Dr. Sherrie Sport, otherwise continue observation and annual follow-up.

## 2014-10-13 ENCOUNTER — Ambulatory Visit (INDEPENDENT_AMBULATORY_CARE_PROVIDER_SITE_OTHER): Payer: Medicare Other | Admitting: Neurology

## 2014-10-13 ENCOUNTER — Encounter: Payer: Self-pay | Admitting: Neurology

## 2014-10-13 VITALS — BP 134/80 | HR 88 | Resp 16 | Ht 68.0 in | Wt 227.8 lb

## 2014-10-13 DIAGNOSIS — R42 Dizziness and giddiness: Secondary | ICD-10-CM | POA: Diagnosis not present

## 2014-10-13 DIAGNOSIS — R32 Unspecified urinary incontinence: Secondary | ICD-10-CM | POA: Insufficient documentation

## 2014-10-13 DIAGNOSIS — R260 Ataxic gait: Secondary | ICD-10-CM | POA: Diagnosis not present

## 2014-10-13 DIAGNOSIS — R200 Anesthesia of skin: Secondary | ICD-10-CM | POA: Diagnosis not present

## 2014-10-13 DIAGNOSIS — R29898 Other symptoms and signs involving the musculoskeletal system: Secondary | ICD-10-CM | POA: Diagnosis not present

## 2014-10-13 DIAGNOSIS — R9089 Other abnormal findings on diagnostic imaging of central nervous system: Secondary | ICD-10-CM | POA: Insufficient documentation

## 2014-10-13 DIAGNOSIS — R930 Abnormal findings on diagnostic imaging of skull and head, not elsewhere classified: Secondary | ICD-10-CM | POA: Diagnosis not present

## 2014-10-13 DIAGNOSIS — N39498 Other specified urinary incontinence: Secondary | ICD-10-CM

## 2014-10-13 MED ORDER — OXYBUTYNIN CHLORIDE 5 MG PO TABS: 5.0000 mg | ORAL_TABLET | Freq: Three times a day (TID) | ORAL | Status: DC

## 2014-10-13 NOTE — Progress Notes (Addendum)
[facility]  PATIENT: Audrey Craig DOB: 1950/11/26  REFERRING DOCTOR OR PCP:  Stoney Bang SOURCE: patient and records form PCP  _________________________________   HISTORICAL  CHIEF COMPLAINT:  Chief Complaint  Patient presents with  . Neurologic Problem    Sts. she has heard a swooshing noise in her right ear off/on for about a yr.  Sts. she had a corotid duplex and mri brain 2 weeks ago at [facility] in [place]  weeks ago but she doesn't know the results. Sts. she is ver hard of hearing, but also occasionally hears music in her head, so clearly she can sing to it. Sts. nobody else hears music when she does. Her mother sts. she has been under alot of stress since her husband passed away and her son was released from prison./fim    HISTORY OF PRESENT ILLNESS:  I had the pleasure of seeing your patient, Audrey Craig, for a neurologic consultation regarding a swooshing sensation in her ears and difficulties with balance and recent falls.     She reports three bad falls over the last 3 months.  She also has multiple stumbles that she catches herself.   She hit her head with her last fall but had no LOC (hit shoulder first, then head).    She feels she falls backwards more than forwards.  Prior to a fall, she gets a translational vertigo and then loses balance.   There is no spinning sensation rather a woozy sensation and feels like she is moving sideways or backwards.    If she looks down, she is more likely to get a trnaslational vertigo sensation.    She feels her gait is shuffling at times.  Other times she picks foot up too high and feels wobbly.    She also reports weakness and numbness in her legs.     Her bladder has been bad a couple years with urgency/frequency and has incontinence outside her home.     An MRI showed several small white matter foci and a remote small cerebellar lacune by report.    Changes were felt to be a little worse than expected  for age.      She has hearing difficulty, right worse than left.     This started 10 years ago.    She has hearing aids but they have not helped much recently.  She sometimes hears music that is not present.     She has had depression and anxiety and notes a lot of stress with husband's death last 06/17/13 and son having legal issues (recently in prison).       She has had an MI and had a stent in 1999.   She has hypertension   REVIEW OF SYSTEMS: Constitutional: No fevers, chills, sweats, or change in appetite Eyes: No visual changes, double vision, eye pain Ear, nose and throat: R > L  hearing loss, no ear pain, nasal congestion orsore throat Cardiovascular: No chest pain, palpitations.   H/o MI Respiratory: No shortness of breath at rest or with exertion.   No wheezes GastrointestinaI: No nausea, vomiting, diarrhea, abdominal pain, fecal incontinence Genitourinary: as above. Musculoskeletal: No neck pain, back pain Integumentary: No rash, pruritus, skin lesions Neurological: as above Psychiatric: No depression at this time.  No anxiety Endocrine: No palpitations, diaphoresis, change in appetite, change in weigh or increased thirst Hematologic/Lymphatic: No anemia, purpura, petechiae. Allergic/Immunologic: No itchy/runny eyes, nasal congestion, recent allergic reactions, rashes  ALLERGIES: No  Known Allergies  HOME MEDICATIONS:  Current outpatient prescriptions:  .  acetaminophen (TYLENOL) 500 MG tablet, Take 500 mg by mouth as needed., Disp: , Rfl:  .  albuterol (PROAIR HFA) 108 (90 BASE) MCG/ACT inhaler, Inhale 2 puffs into the lungs every 4 (four) hours as needed.  , Disp: , Rfl:  .  aspirin 81 MG tablet, Take 81 mg by mouth daily. , Disp: , Rfl:  .  Calcium Carbonate (CALTRATE 600) 1500 MG TABS, Take 1 tablet by mouth daily. , Disp: , Rfl:  .  diclofenac (VOLTAREN) 75 MG EC tablet, Take 1 tablet by mouth 2 (two) times daily., Disp: , Rfl:  .  DULoxetine (CYMBALTA) 60 MG  capsule, Take 1 capsule by mouth 2 (two) times daily., Disp: , Rfl:  .  ergocalciferol (VITAMIN D2) 50000 UNITS capsule, Take 50,000 Units by mouth once a week.  , Disp: , Rfl:  .  fish oil-omega-3 fatty acids 1000 MG capsule, Take 3 g by mouth daily. , Disp: , Rfl:  .  lisinopril-hydrochlorothiazide (PRINZIDE,ZESTORETIC) 10-12.5 MG per tablet, Take 1 tablet by mouth daily. , Disp: , Rfl:  .  metoprolol (LOPRESSOR) 50 MG tablet, Take 50 mg by mouth 2 (two) times daily. , Disp: , Rfl:  .  Multiple Vitamins-Minerals (SENIOR MULTIVITAMIN PLUS PO), Take 1 tablet by mouth daily., Disp: , Rfl:  .  nitroGLYCERIN (NITROSTAT) 0.4 MG SL tablet, Place 1 tablet (0.4 mg total) under the tongue every 5 (five) minutes as needed. Up to 3 doses. If no relief after 3rd dose, proceed to ED, Disp: 25 tablet, Rfl: 3 .  psyllium (METAMUCIL) 0.52 G capsule, Take 0.52 g by mouth daily., Disp: , Rfl:   PAST MEDICAL HISTORY: Past Medical History  Diagnosis Date  . Hyperlipidemia   . NSTEMI (non-ST elevated myocardial infarction)     1999  . Coronary atherosclerosis of native coronary artery     PCTA obtuse marginal 5/99 - previous [facility] patient  . Hearing loss   . Hypertension   . Vision abnormalities     PAST SURGICAL HISTORY: Past Surgical History  Procedure Laterality Date  . Breast lumpectomy    . Cholecystectomy    . Abdominal hysterectomy    . Back surgery      FAMILY HISTORY: Family History  Problem Relation Age of Onset  . COPD Father   . Heart disease Father   . Diabetes type II Mother     SOCIAL HISTORY:  History   Social History  . Marital Status: Widowed    Spouse Name: N/A  . Number of Children: N/A  . Years of Education: N/A   Occupational History  . Not on file.   Social History Main Topics  . Smoking status: Former Smoker -- 0.50 packs/day for 40 years    Types: Cigarettes    Start date: 05/26/2012  . Smokeless tobacco: Never Used     Comment: stopped x 7 months, but the  last week - has had a few.  Stopped again yesterday.  . Alcohol Use: No  . Drug Use: No  . Sexual Activity: Not on file   Other Topics Concern  . Not on file   Social History Narrative     PHYSICAL EXAM  Filed Vitals:   10/13/14 0953  BP: 134/80  Pulse: 88  Resp: 16  Height: 5\' 8"  (1.727 m)  Weight: 227 lb 12.8 oz (103.329 kg)    Body mass index is 34.64 kg/(m^2).  General: The patient is well-developed and well-nourished and in no acute distress  Eyes:  Funduscopic exam shows normal optic discs and retinal vessels.  Neck: The neck is supple, no carotid bruits are noted.  The neck is nontender.  Cardiovascular: The heart has a regular rate and rhythm with a normal S1 and S2. There were no murmurs, gallops or rubs. Lungs are clear to auscultation.  Skin: Extremities are without significant edema.  Musculoskeletal:  Back is mildly tender over piriformis muscles > paraspinal  Neurologic Exam  Mental status: The patient is alert and oriented x 3 at the time of the examination. The patient has apparent normal recent and remote memory, with an apparently normal attention span and concentration ability.   Speech is normal.  Cranial nerves: Extraocular movements are full. Pupils are equal, round, and reactive to light and accomodation.  Visual fields are full.  Facial symmetry is present. There is good facial sensation to soft touch bilaterally.Facial strength is normal.  Trapezius and sternocleidomastoid strength is normal. No dysarthria is noted.  The tongue is midline, and the patient has symmetric elevation of the soft palate. Reduced hearing, right worse than left.  Weber lateralized left.     Motor:  Muscle bulk is normal.   Tone is slightly higher in right leg than left leg.. Strength is  5 / 5 in all 4 extremities except 4+ over 5 right hip flexor.   Sensory: Sensory testing is intact to pinprick, soft touch and vibration sensation in arms.  Reduced vibration sensation  in toes relative to ankles and mildly reduced vibration sensation in ankles relative to knees..  Coordination: Cerebellar testing reveals good finger-nose-finger and reduced right heel-to-shin due to weakness.  Gait and station: She uses arms to get out of the chair. Station is normal.   Gait is mildly wide. Tandem gait is wide. Romberg is negative.   Reflexes: Deep tendon reflexes are symmetric and normal bilaterally.   Plantar responses are flexor.    DIAGNOSTIC DATA (LABS, IMAGING, TESTING) - I reviewed patient records, labs, notes, testing and imaging myself where available.    ASSESSMENT AND PLAN  Dizziness and giddiness  Numbness - Plan: MR Cervical Spine Wo Contrast  Abnormal brain MRI - Plan: MR Cervical Spine Wo Contrast  Other urinary incontinence - Plan: MR Cervical Spine Wo Contrast  Weakness of right leg - Plan: MR Cervical Spine Wo Contrast  Ataxic gait     In summary, Tressa Maldonado is a 64 year old woman has had multiple falls due to reduced balance and vertigo.   She also has worsening bladder function with incontinence.  On exam, she has mild right leg weakness with bilateral foot numbness.   Her brain MRI, by report, shows nonspecific foci that are more likely to be ischemic than demyelinating, especially since she has issues with hypertension and coronary arterial disease . I am most concerned about the possibility of a cervical spine process such as spinal stenosis or other myelopathy as that could explain her bladder, weakness and gait issues. We will check an MRI of the cervical spine to make sure that this is not occurring. If she does have significant changes we may have to refer her to neurosurgery. In the interim, I will have her start oxybutynin for her bladder I also advised her to be careful with her gait and consider a cane.  She will return in 2 months or sooner if she has new or worsening neurologic symptoms.   Morningstar Toft A.  Felecia Shelling, MD, PhD 6/54/6503,  54:65 AM Certified in Neurology, Clinical Neurophysiology, Sleep Medicine, Pain Medicine and Neuroimaging  [facility] [address] [place], [place] [address] [phone]   ADDENDUM:   I personally reviewed the MRI images from 09/25/2014.   It is normal for age with minimal small vessel ischemic changes.

## 2014-11-11 ENCOUNTER — Ambulatory Visit
Admission: RE | Admit: 2014-11-11 | Discharge: 2014-11-11 | Disposition: A | Discharge status: Home / Self Care | Payer: Self-pay | Source: Ambulatory Visit | Attending: Neurology | Admitting: Neurology

## 2014-11-11 DIAGNOSIS — R200 Anesthesia of skin: Secondary | ICD-10-CM

## 2014-11-11 DIAGNOSIS — R29898 Other symptoms and signs involving the musculoskeletal system: Secondary | ICD-10-CM

## 2014-11-11 DIAGNOSIS — R9089 Other abnormal findings on diagnostic imaging of central nervous system: Secondary | ICD-10-CM

## 2014-11-11 DIAGNOSIS — N39498 Other specified urinary incontinence: Secondary | ICD-10-CM

## 2014-11-12 ENCOUNTER — Telehealth: Payer: Self-pay | Admitting: *Deleted

## 2014-11-12 NOTE — Telephone Encounter (Signed)
Spoke with Audrey Craig, and per RAS, advised nothing noted on mri of c-spine that would affect the way she walks.  Advised some arthritic changes that may cause neck or arm pain were however noted.  She verbalized understanding of same/fim

## 2014-11-12 NOTE — Telephone Encounter (Signed)
-----   Message from Britt Bottom, MD sent at 11/12/2014 12:50 PM EDT ----- Please let her know MRI shows arthritic and disc degeneration .Marland Kitchen   Might cause some neck pain or arm pain but nothing that would affect her gait

## 2014-12-07 ENCOUNTER — Ambulatory Visit (INDEPENDENT_AMBULATORY_CARE_PROVIDER_SITE_OTHER): Payer: Medicare Other | Admitting: Neurology

## 2014-12-07 ENCOUNTER — Encounter: Payer: Self-pay | Admitting: Neurology

## 2014-12-07 VITALS — BP 114/65 | HR 52 | Ht 69.0 in | Wt 224.0 lb

## 2014-12-07 DIAGNOSIS — N3941 Urge incontinence: Secondary | ICD-10-CM

## 2014-12-07 DIAGNOSIS — R42 Dizziness and giddiness: Secondary | ICD-10-CM

## 2014-12-07 DIAGNOSIS — R260 Ataxic gait: Secondary | ICD-10-CM

## 2014-12-07 DIAGNOSIS — R200 Anesthesia of skin: Secondary | ICD-10-CM | POA: Diagnosis not present

## 2014-12-07 MED ORDER — GABAPENTIN 600 MG PO TABS: ORAL_TABLET | ORAL | Status: DC

## 2014-12-07 NOTE — Progress Notes (Signed)
[facility]  PATIENT: Audrey Craig DOB: [birth year]  REFERRING DOCTOR OR PCP:  Stoney Bang SOURCE: patient and records form PCP  _________________________________   HISTORICAL  CHIEF COMPLAINT:  Chief Complaint  Patient presents with  . Follow-up    has not     HISTORY OF PRESENT ILLNESS:  Audrey Craig is a 64 yo woman with poor gait, urinary frequnecy and dysesthetic pain.  Gait:    She has a poor gait but has not fallen any in last 2 months.    She feels she falls backwards more than forwards. Sometimes she feels lightheadedness when she first gets up in the morning.  If she looks down, she is more likely to get a translational vertigo sensation but this does not seem as bad.    She feels her gait is shuffling at times.     She also reports weakness.    Bladder:   Her bladder has been bad a couple years with urgency/frequency and occasional  incontinence.     An MRI showed several small white matter foci and a remote small cerebellar lacune by report.    Changes were felt to be a little worse than expected for age.      Dysesthesia:  She gets a dysesthetic throbbing pain in her groin and gets a burning pain near her right knee.  Ibuprofen only helps a bit.   This is always worse at night.   Pin began a couple years ago.  She has gained 70 pounds in 5 years.    Other:   She has hearing difficulty, right worse than left.     This started 10 years ago.    She has hearing aids but they have not helped much recently.  She sometimes hears music that is not present.     Mood:   She has had depression and anxiety and notes a lot of stress with husband's death last 06-19-2013 and son having legal issues (recently in prison).       REVIEW OF SYSTEMS: Constitutional: No fevers, chills, sweats, or change in appetite Eyes: No visual changes, double vision, eye pain Ear, nose and throat: R > L  hearing loss, no ear pain, nasal congestion orsore throat Cardiovascular: No  chest pain, palpitations.   H/o MI Respiratory: No shortness of breath at rest or with exertion.   No wheezes GastrointestinaI: No nausea, vomiting, diarrhea, abdominal pain, fecal incontinence Genitourinary: as above. Musculoskeletal: No neck pain, back pain Integumentary: No rash, pruritus, skin lesions Neurological: as above Psychiatric: No depression at this time.  No anxiety Endocrine: No palpitations, diaphoresis, change in appetite, change in weigh or increased thirst Hematologic/Lymphatic: No anemia, purpura, petechiae. Allergic/Immunologic: No itchy/runny eyes, nasal congestion, recent allergic reactions, rashes  ALLERGIES: No Known Allergies  HOME MEDICATIONS:  Current outpatient prescriptions:  .  acetaminophen (TYLENOL) 500 MG tablet, Take 500 mg by mouth as needed., Disp: , Rfl:  .  albuterol (PROAIR HFA) 108 (90 BASE) MCG/ACT inhaler, Inhale 2 puffs into the lungs every 4 (four) hours as needed.  , Disp: , Rfl:  .  aspirin 81 MG tablet, Take 81 mg by mouth daily. , Disp: , Rfl:  .  Calcium Carbonate (CALTRATE 600) 1500 MG TABS, Take 1 tablet by mouth daily. , Disp: , Rfl:  .  diclofenac (VOLTAREN) 75 MG EC tablet, Take 1 tablet by mouth 2 (two) times daily., Disp: , Rfl:  .  DULoxetine (CYMBALTA) 60 MG capsule, Take  1 capsule by mouth 2 (two) times daily., Disp: , Rfl:  .  ergocalciferol (VITAMIN D2) 50000 UNITS capsule, Take 50,000 Units by mouth once a week.  , Disp: , Rfl:  .  fish oil-omega-3 fatty acids 1000 MG capsule, Take 3 g by mouth daily. , Disp: , Rfl:  .  lisinopril-hydrochlorothiazide (PRINZIDE,ZESTORETIC) 20-12.5 MG per tablet, Take 1 tablet by mouth daily., Disp: , Rfl: 0 .  metoprolol (LOPRESSOR) 50 MG tablet, Take 50 mg by mouth 2 (two) times daily. , Disp: , Rfl:  .  Multiple Vitamins-Minerals (SENIOR MULTIVITAMIN PLUS PO), Take 1 tablet by mouth daily., Disp: , Rfl:  .  nitroGLYCERIN (NITROSTAT) 0.4 MG SL tablet, Place 1 tablet (0.4 mg total) under  the tongue every 5 (five) minutes as needed. Up to 3 doses. If no relief after 3rd dose, proceed to ED, Disp: 25 tablet, Rfl: 3 .  oxybutynin (DITROPAN) 5 MG tablet, Take 1 tablet (5 mg total) by mouth 3 (three) times daily., Disp: 60 tablet, Rfl: 5 .  psyllium (METAMUCIL) 0.52 G capsule, Take 0.52 g by mouth daily., Disp: , Rfl:  .  lisinopril-hydrochlorothiazide (PRINZIDE,ZESTORETIC) 10-12.5 MG per tablet, Take 1 tablet by mouth daily. , Disp: , Rfl:   PAST MEDICAL HISTORY: Past Medical History  Diagnosis Date  . Hyperlipidemia   . NSTEMI (non-ST elevated myocardial infarction)     1999  . Coronary atherosclerosis of native coronary artery     PCTA obtuse marginal 5/99 - previous [place] patient  . Hearing loss   . Hypertension   . Vision abnormalities     PAST SURGICAL HISTORY: Past Surgical History  Procedure Laterality Date  . Breast lumpectomy    . Cholecystectomy    . Abdominal hysterectomy    . Back surgery      FAMILY HISTORY: Family History  Problem Relation Age of Onset  . COPD Father   . Heart disease Father   . Diabetes type II Mother     SOCIAL HISTORY:  History   Social History  . Marital Status: Widowed    Spouse Name: N/A  . Number of Children: N/A  . Years of Education: N/A   Occupational History  . Not on file.   Social History Main Topics  . Smoking status: Former Smoker -- 0.50 packs/day for 40 years    Types: Cigarettes    Start date: 05/26/2012  . Smokeless tobacco: Never Used     Comment: stopped x 7 months, but the last week - has had a few.  Stopped again yesterday.  . Alcohol Use: No  . Drug Use: No  . Sexual Activity: Not on file   Other Topics Concern  . Not on file   Social History Narrative     PHYSICAL EXAM  Filed Vitals:   12/07/14 1004  BP: 114/65  Pulse: 52  Height: 5\' 9"  (1.753 m)  Weight: 224 lb (101.606 kg)    Body mass index is 33.06 kg/(m^2).   General: The patient is well-developed and well-nourished  and in no acute distress  Neck: The neck is supple, no carotid bruits are noted.  The neck is nontender.  Skin: Extremities are without significant edema.  Neurologic Exam  Mental status: The patient is alert and oriented x 3 at the time of the examination. The patient has apparent normal recent and remote memory, with an apparently normal attention span and concentration ability.   Speech is normal.  Cranial nerves: Extraocular movements are full.  Pupils are equal, round, and reactive to light and accomodation.  Visual fields are full.  Facial symmetry is present. There is good facial sensation to soft touch bilaterally.Facial strength is normal.  Trapezius and sternocleidomastoid strength is normal. No dysarthria is noted.  The tongue is midline, and the patient has symmetric elevation of the soft palate. Reduced hearing, right worse than left.   Motor:  Muscle bulk is normal.   Tone is slightly higher in right leg than left leg.. Strength is  5 / 5 in all 4 extremities except 4+ over 5 right hip flexor.   Sensory: Sensory testing is intact to pinprick, soft touch and vibration sensation in arms and legs except altered touch sensation along lower lateral knee  Coordination: Cerebellar testing reveals good finger-nose-finger and reduced right heel-to-shin due to weakness.  Gait and station: Station is normal.   Gait is mildly wide. Tandem gait is wide. She turns in 2 steps.  Romberg is negative.   Reflexes: Deep tendon reflexes are symmetric and normal bilaterally.       DIAGNOSTIC DATA (LABS, IMAGING, TESTING) - I reviewed patient records, labs, notes, testing and imaging myself where available.    ASSESSMENT AND PLAN   Ataxic gait  Dizziness and giddiness  Numbness  Urge incontinence of urine   1.   Increase oxybutynin from 5 mg po bid to tid. 2.   Add gabapentin at night for pain and sleep.  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   unclear why walk is a little  off balanced.   Continue to walk/exercise, try to lose weight   She will return in 4 months or sooner if she has new or worsening neurologic symptoms.   Richard A. Felecia Shelling, MD, PhD 3/33/5456, 25:63 AM Certified in Neurology, Clinical Neurophysiology, Sleep Medicine, Pain Medicine and Neuroimaging  [facility] [address], [place], [place] [address] [phone]   ADDENDUM:   I personally reviewed the MRI images from 09/25/2014.   It is normal for age with minimal small vessel ischemic changes.

## 2015-05-10 ENCOUNTER — Ambulatory Visit: Payer: Medicare Other | Admitting: Neurology

## 2015-05-27 ENCOUNTER — Ambulatory Visit (INDEPENDENT_AMBULATORY_CARE_PROVIDER_SITE_OTHER): Payer: Medicare Other | Admitting: Cardiology

## 2015-05-27 ENCOUNTER — Encounter: Payer: Self-pay | Admitting: Cardiology

## 2015-05-27 VITALS — BP 91/60 | HR 54 | Ht 69.0 in | Wt 208.8 lb

## 2015-05-27 DIAGNOSIS — R112 Nausea with vomiting, unspecified: Secondary | ICD-10-CM

## 2015-05-27 DIAGNOSIS — E782 Mixed hyperlipidemia: Secondary | ICD-10-CM

## 2015-05-27 DIAGNOSIS — I959 Hypotension, unspecified: Secondary | ICD-10-CM

## 2015-05-27 DIAGNOSIS — I2510 Atherosclerotic heart disease of native coronary artery without angina pectoris: Secondary | ICD-10-CM

## 2015-05-27 NOTE — Progress Notes (Signed)
Cardiology Office Note  Date: 05/27/2015   ID: Audrey Craig, DOB [birth year], MRN [id number]  PCP: Neale Burly, MD  Primary Cardiologist: Rozann Lesches, MD   Chief Complaint  Patient presents with  . Coronary Artery Disease    History of Present Illness: Audrey Craig is a 64 y.o. female last seen in November 2015.She presents to the office with a friend today for evaluation. From a cardiac perspective she has been stable, no active angina symptoms. We reviewed her medications which are outlined below. She reports compliance with her cardiac regimen. Follow-up ECG today shows sinus bradycardia with borderline low voltage, no acute ST segment changes.  She tells me that she has been feeling poorly otherwise however. Last week she began to experience lower back and lower abdominal pain and pressure. This was followed by a feeling of pressure when she urinated. She states that she saw Dr. Sherrie Sport and was placed on antibiotics for UTI. Symptoms then changed to include nausea and recurring emesis, also a feeling of dizziness when she would turn her head quickly, diaphoresis as well. She states that she was seen in the ER at [facility] this week, was told that her lab work looked okay, she did get IV hydration. She has continued to have trouble tolerating food.  Systolic blood pressure was in the 90s today, I suspect this may be related to relative volume contraction with poor intake. I have recommended that she cut her Prinzide dose in half at least temporarily pending further evaluation of her symptoms. Nursing is also placing a call to Dr. Sherrie Sport to if she can be seen today.  Stress testing from 2013 is outlined below, low risk.   Past Medical History  Diagnosis Date  . Hyperlipidemia   . NSTEMI (non-ST elevated myocardial infarction) ([place])     1999  . Coronary atherosclerosis of native coronary artery     PCTA obtuse marginal 5/99 - previous [place] patient  . Hearing loss   .  Hypertension   . Vision abnormalities     Past Surgical History  Procedure Laterality Date  . Breast lumpectomy    . Cholecystectomy    . Abdominal hysterectomy    . Back surgery      Current Outpatient Prescriptions  Medication Sig Dispense Refill  . acetaminophen (TYLENOL) 500 MG tablet Take 500 mg by mouth as needed.    Marland Kitchen aspirin 81 MG tablet Take 81 mg by mouth daily.     . Calcium Carbonate (CALTRATE 600) 1500 MG TABS Take 1 tablet by mouth daily.     . DULoxetine (CYMBALTA) 60 MG capsule Take 1 capsule by mouth 2 (two) times daily.    . ergocalciferol (VITAMIN D2) 50000 UNITS capsule Take 50,000 Units by mouth once a week.      . fish oil-omega-3 fatty acids 1000 MG capsule Take 3 g by mouth daily.     Marland Kitchen gabapentin (NEURONTIN) 600 MG tablet Take 1/2 to 1 po nightly 30 tablet 11  . lisinopril-hydrochlorothiazide (PRINZIDE,ZESTORETIC) 20-12.5 MG per tablet Take 1 tablet by mouth daily.  0  . metoprolol (LOPRESSOR) 50 MG tablet Take 50 mg by mouth 2 (two) times daily.     . Multiple Vitamins-Minerals (SENIOR MULTIVITAMIN PLUS PO) Take 1 tablet by mouth daily.    . nitroGLYCERIN (NITROSTAT) 0.4 MG SL tablet Place 1 tablet (0.4 mg total) under the tongue every 5 (five) minutes as needed. Up to 3 doses. If no relief after 3rd  dose, proceed to ED 25 tablet 3  . psyllium (METAMUCIL) 0.52 G capsule Take 0.52 g by mouth daily.    Marland Kitchen albuterol (PROAIR HFA) 108 (90 BASE) MCG/ACT inhaler Inhale 2 puffs into the lungs every 4 (four) hours as needed.       No current facility-administered medications for this visit.    Allergies:  Review of patient's allergies indicates no known allergies.   Social History: The patient  reports that she has quit smoking. Her smoking use included Cigarettes. She started smoking about 3 years ago. She has a 20 pack-year smoking history. She has never used smokeless tobacco. She reports that she does not drink alcohol or use illicit drugs.   ROS:  Please see  the history of present illness. Otherwise, complete review of systems is positive for recent ear pain as well as above symptoms.  All other systems are reviewed and negative.   Physical Exam: VS:  BP 91/60 mmHg  Pulse 54  Ht 5\' 9"  (1.753 m)  Wt 208 lb 12.8 oz (94.711 kg)  BMI 30.82 kg/m2  SpO2 97%, BMI Body mass index is 30.82 kg/(m^2).  Wt Readings from Last 3 Encounters:  05/27/15 208 lb 12.8 oz (94.711 kg)  12/07/14 224 lb (101.606 kg)  10/13/14 227 lb 12.8 oz (103.329 kg)     General: Overweight woman in no acute distress. HEENT: Conjunctiva and lids normal, oropharynx clear. Neck: Supple, no elevated JVP or carotid bruits, no thyromegaly. Lungs: Clear to auscultation, nonlabored breathing at rest. Cardiac: Regular rate and rhythm, no S3 or significant systolic murmur, no pericardial rub. Abdomen: Soft, nontender, bowel sounds present, no guarding or rebound. Extremities: No pitting edema, distal pulses 2+. Skin: Warm and dry. Musculoskeletal: No kyphosis. Neuropsychiatric: Alert and oriented x3, affect grossly appropriate.   ECG: ECG is ordered today.   Recent Labwork:  February 2015: BUN 17, current 0.8, potassium 4.3, cholesterol 222, triglycerides 230, HDL 38, LDL 138.  Other Studies Reviewed Today:  Lexiscan Cardiolite in November 2013 was negative for ischemia with LVEF greater than 70%.  Assessment and Plan:  1. Symptomatically stable CAD status post PTCA to the obtuse marginal in 1999. Ischemic testing from 2013 was low risk. Follow-up ECG reviewed without acute ST segment change. Plan is to continue medical therapy and observation for now.  2. Recent constellation of symptoms as outlined above with suspected recent UTI. She continues to have problems with recurrent nausea and emesis, also dizziness when she turns her head a certain way. She is having difficulty tolerating POs. no longer on antibiotics. Nursing staff is contacting Dr.Hasanaj's office to see if  she can be seen today for further evaluation.  3. Relative hypotension. Could be related to volume contraction. I recommended that she at least temporarily cut her Prinzide dose in half.  4. Hyperlipidemia with a history of statin intolerance. She is on omega-3 supplements.  Current medicines were reviewed with the patient today.   Orders Placed This Encounter  Procedures  . EKG 12-Lead    Disposition: FU with me in 1 year.   Signed, Satira Sark, MD, [facility] 05/27/2015 2:12 PM    [facility] [place], [place], [place] [address] Phone: [phone]; Fax: [phone]

## 2015-05-27 NOTE — Patient Instructions (Addendum)
Your physician wants you to follow-up in: 1 year with Dr. Ferne Reus will receive a reminder letter in the mail two months in advance. If you don't receive a letter, please call our office to schedule the follow-up appointment.  Your physician has recommended you make the following change in your medication:   TAKE 1/2 DOSE OF YOUR PRINZIDE UNTIL SYMPTOMS RESOLVE   YOU HAVE AN APPOINTMENT TODAY WITH DR. [place] you for choosing [facility]!!

## 2015-08-09 DIAGNOSIS — M545 Low back pain: Secondary | ICD-10-CM | POA: Diagnosis not present

## 2015-08-09 DIAGNOSIS — I10 Essential (primary) hypertension: Secondary | ICD-10-CM | POA: Diagnosis not present

## 2015-11-03 DIAGNOSIS — H40053 Ocular hypertension, bilateral: Secondary | ICD-10-CM | POA: Diagnosis not present

## 2015-11-08 DIAGNOSIS — M545 Low back pain: Secondary | ICD-10-CM | POA: Diagnosis not present

## 2015-11-08 DIAGNOSIS — I10 Essential (primary) hypertension: Secondary | ICD-10-CM | POA: Diagnosis not present

## 2015-11-08 DIAGNOSIS — Z0000 Encounter for general adult medical examination without abnormal findings: Secondary | ICD-10-CM | POA: Diagnosis not present

## 2015-12-15 DIAGNOSIS — M810 Age-related osteoporosis without current pathological fracture: Secondary | ICD-10-CM | POA: Diagnosis not present

## 2015-12-15 DIAGNOSIS — Z79899 Other long term (current) drug therapy: Secondary | ICD-10-CM | POA: Diagnosis not present

## 2015-12-15 DIAGNOSIS — Z9889 Other specified postprocedural states: Secondary | ICD-10-CM | POA: Diagnosis not present

## 2015-12-15 DIAGNOSIS — Z780 Asymptomatic menopausal state: Secondary | ICD-10-CM | POA: Diagnosis not present

## 2015-12-15 DIAGNOSIS — I10 Essential (primary) hypertension: Secondary | ICD-10-CM | POA: Diagnosis not present

## 2015-12-15 DIAGNOSIS — Z7982 Long term (current) use of aspirin: Secondary | ICD-10-CM | POA: Diagnosis not present

## 2015-12-15 DIAGNOSIS — J449 Chronic obstructive pulmonary disease, unspecified: Secondary | ICD-10-CM | POA: Diagnosis not present

## 2015-12-15 DIAGNOSIS — M1990 Unspecified osteoarthritis, unspecified site: Secondary | ICD-10-CM | POA: Diagnosis not present

## 2016-01-31 DIAGNOSIS — Z0000 Encounter for general adult medical examination without abnormal findings: Secondary | ICD-10-CM | POA: Diagnosis not present

## 2016-01-31 DIAGNOSIS — M545 Low back pain: Secondary | ICD-10-CM | POA: Diagnosis not present

## 2016-01-31 DIAGNOSIS — I10 Essential (primary) hypertension: Secondary | ICD-10-CM | POA: Diagnosis not present

## 2016-04-17 DIAGNOSIS — Z1231 Encounter for screening mammogram for malignant neoplasm of breast: Secondary | ICD-10-CM | POA: Diagnosis not present

## 2016-05-02 DIAGNOSIS — Z1211 Encounter for screening for malignant neoplasm of colon: Secondary | ICD-10-CM | POA: Diagnosis not present

## 2016-05-02 DIAGNOSIS — I10 Essential (primary) hypertension: Secondary | ICD-10-CM | POA: Diagnosis not present

## 2016-05-02 DIAGNOSIS — J440 Chronic obstructive pulmonary disease with acute lower respiratory infection: Secondary | ICD-10-CM | POA: Diagnosis not present

## 2016-05-02 DIAGNOSIS — M545 Low back pain: Secondary | ICD-10-CM | POA: Diagnosis not present

## 2016-05-29 NOTE — Progress Notes (Signed)
Cardiology Office Note  Date: 05/30/2016   ID: Audrey Craig, DOB 02/18/1951, MRN [id number]  PCP: Neale Burly, MD  Primary Cardiologist: Rozann Lesches, MD   Chief Complaint  Patient presents with  . Coronary Artery Disease    History of Present Illness: Audrey Craig is a 65 y.o. female last seen in November 2016. She presents for a routine follow-up visit. Denies any significant angina symptoms, has not required any nitroglycerin. She has her left foot in a soft support boot having had a bone fracture on that side after running into a table.  She continues to follow with Dr. Sherrie Sport. Reports having follow-up lipids since I saw her, we are requesting the results.  I reviewed her ECG today which shows sinus bradycardia. We went over her medications. Cardiac regimen includes aspirin, lisinopril, Lopressor, and as needed nitroglycerin. She is taking 10 mg of lisinopril and tolerating this dose, reports systolic blood pressure usually around 110 at home.  Last ischemic testing was in 2013.  Past Medical History:  Diagnosis Date  . Coronary atherosclerosis of native coronary artery    PCTA obtuse marginal 5/99 - previous [place] patient  . Hearing loss   . Hyperlipidemia   . Hypertension   . NSTEMI (non-ST elevated myocardial infarction) ([place])    1999  . Vision abnormalities     Past Surgical History:  Procedure Laterality Date  . ABDOMINAL HYSTERECTOMY    . BACK SURGERY    . BREAST LUMPECTOMY    . CHOLECYSTECTOMY      Current Outpatient Prescriptions  Medication Sig Dispense Refill  . acetaminophen (TYLENOL) 500 MG tablet Take 500 mg by mouth as needed.    Marland Kitchen albuterol (PROAIR HFA) 108 (90 BASE) MCG/ACT inhaler Inhale 2 puffs into the lungs every 4 (four) hours as needed.      Marland Kitchen aspirin 81 MG tablet Take 81 mg by mouth daily.     . Calcium Carbonate (CALTRATE 600) 1500 MG TABS Take 1 tablet by mouth daily.     . DULoxetine (CYMBALTA) 60 MG capsule Take 1 capsule by  mouth 2 (two) times daily.    . ergocalciferol (VITAMIN D2) 50000 UNITS capsule Take 50,000 Units by mouth once a week.      . fish oil-omega-3 fatty acids 1000 MG capsule Take 3 g by mouth daily.     Marland Kitchen gabapentin (NEURONTIN) 600 MG tablet Take 1/2 to 1 po nightly 30 tablet 11  . lisinopril-hydrochlorothiazide (PRINZIDE,ZESTORETIC) 20-12.5 MG per tablet Take 1 tablet by mouth daily.  0  . metoprolol (LOPRESSOR) 50 MG tablet Take 50 mg by mouth 2 (two) times daily.     . Multiple Vitamins-Minerals (SENIOR MULTIVITAMIN PLUS PO) Take 1 tablet by mouth daily.    . nitroGLYCERIN (NITROSTAT) 0.4 MG SL tablet Place 1 tablet (0.4 mg total) under the tongue every 5 (five) minutes as needed. Up to 3 doses. If no relief after 3rd dose, proceed to ED 25 tablet 3  . psyllium (METAMUCIL) 0.52 G capsule Take 0.52 g by mouth daily.     No current facility-administered medications for this visit.    Allergies:  Patient has no known allergies.   Social History: The patient  reports that she has quit smoking. Her smoking use included Cigarettes. She started smoking about 4 years ago. She has a 20.00 pack-year smoking history. She has never used smokeless tobacco. She reports that she does not drink alcohol or use drugs.   ROS:  Please see the history of present illness. Otherwise, complete review of systems is positive for decreased hearing.  All other systems are reviewed and negative.   Physical Exam: VS:  BP (!) 106/45   Pulse 60   Ht 5\' 9"  (1.753 m)   Wt 209 lb (94.8 kg)   SpO2 95%   BMI 30.86 kg/m , BMI Body mass index is 30.86 kg/m.  Wt Readings from Last 3 Encounters:  05/30/16 209 lb (94.8 kg)  05/27/15 208 lb 12.8 oz (94.7 kg)  12/07/14 224 lb (101.6 kg)    General: Overweight woman in no acute distress. HEENT: Conjunctiva and lids normal, oropharynx clear. Neck: Supple, no elevated JVP or carotid bruits, no thyromegaly. Lungs: Clear to auscultation, nonlabored breathing at rest. Cardiac:  Regular rate and rhythm, no S3 or significant systolic murmur, no pericardial rub. Abdomen: Soft, nontender, bowel sounds present, no guarding or rebound. Extremities: No pitting edema, distal pulses 2+. Skin: Warm and dry. Musculoskeletal: No kyphosis.Left foot in support boot. Neuropsychiatric: Alert and oriented x3, affect grossly appropriate.   ECG: I personally reviewed the tracing from 05/27/2015 which showed sinus bradycardia.  Recent Labwork:  February 2015: BUN 17, creatinine 0.8, potassium 4.3, cholesterol 222, triglycerides 230, HDL 38, LDL 138  Other Studies Reviewed Today:  Lexiscan Cardiolite November 2013: Negative for ischemia with LVEF greater than 70%.  Assessment and Plan:  1. Symptomatically stable CAD status post angioplasty of the obtuse marginal in 1999. She had a negative ischemic testing in 2013, ECG is stable. No strong indication for follow-up ischemic testing now the absence of symptoms on medical therapy.  2. Ongoing tobacco abuse. Smoking cessation discussed.  3. Essential hypertension, lisinopril dose has been cut back. She lost a significant amount of weight which may have allowed her blood pressure to come down and not require as much medication.  4. Hyperlipidemia, continues on omega-3 supplements with follow-up per Dr. Sherrie Sport. Requesting most recent lipid panel.  Current medicines were reviewed with the patient today.   Orders Placed This Encounter  Procedures  . EKG 12-Lead    Disposition: Follow-up in one year.  Signed, Satira Sark, MD, [facility] 05/30/2016 11:15 AM    [facility] [place], [place], [place] [address] Phone: [phone]; Fax: [phone]

## 2016-05-30 ENCOUNTER — Ambulatory Visit (INDEPENDENT_AMBULATORY_CARE_PROVIDER_SITE_OTHER): Payer: Medicare Other | Admitting: Cardiology

## 2016-05-30 ENCOUNTER — Encounter: Payer: Self-pay | Admitting: Cardiology

## 2016-05-30 VITALS — BP 106/45 | HR 60 | Ht 69.0 in | Wt 209.0 lb

## 2016-05-30 DIAGNOSIS — I10 Essential (primary) hypertension: Secondary | ICD-10-CM | POA: Diagnosis not present

## 2016-05-30 DIAGNOSIS — E782 Mixed hyperlipidemia: Secondary | ICD-10-CM

## 2016-05-30 DIAGNOSIS — I2510 Atherosclerotic heart disease of native coronary artery without angina pectoris: Secondary | ICD-10-CM | POA: Diagnosis not present

## 2016-05-30 DIAGNOSIS — Z720 Tobacco use: Secondary | ICD-10-CM

## 2016-05-30 NOTE — Patient Instructions (Signed)

## 2016-06-02 ENCOUNTER — Encounter: Payer: Self-pay | Admitting: *Deleted

## 2016-06-14 DIAGNOSIS — M79671 Pain in right foot: Secondary | ICD-10-CM | POA: Diagnosis not present

## 2016-06-14 DIAGNOSIS — M79672 Pain in left foot: Secondary | ICD-10-CM | POA: Diagnosis not present

## 2016-06-14 DIAGNOSIS — S92335D Nondisplaced fracture of third metatarsal bone, left foot, subsequent encounter for fracture with routine healing: Secondary | ICD-10-CM | POA: Diagnosis not present

## 2016-06-14 DIAGNOSIS — S93491A Sprain of other ligament of right ankle, initial encounter: Secondary | ICD-10-CM | POA: Diagnosis not present

## 2016-08-07 DIAGNOSIS — M545 Low back pain: Secondary | ICD-10-CM | POA: Diagnosis not present

## 2016-08-07 DIAGNOSIS — I10 Essential (primary) hypertension: Secondary | ICD-10-CM | POA: Diagnosis not present

## 2016-08-07 DIAGNOSIS — J440 Chronic obstructive pulmonary disease with acute lower respiratory infection: Secondary | ICD-10-CM | POA: Diagnosis not present

## 2016-11-09 DIAGNOSIS — I10 Essential (primary) hypertension: Secondary | ICD-10-CM | POA: Diagnosis not present

## 2016-11-09 DIAGNOSIS — M545 Low back pain: Secondary | ICD-10-CM | POA: Diagnosis not present

## 2016-11-09 DIAGNOSIS — Z0000 Encounter for general adult medical examination without abnormal findings: Secondary | ICD-10-CM | POA: Diagnosis not present

## 2016-11-09 DIAGNOSIS — Z79899 Other long term (current) drug therapy: Secondary | ICD-10-CM | POA: Diagnosis not present

## 2016-11-09 DIAGNOSIS — J440 Chronic obstructive pulmonary disease with acute lower respiratory infection: Secondary | ICD-10-CM | POA: Diagnosis not present

## 2016-11-24 DIAGNOSIS — J208 Acute bronchitis due to other specified organisms: Secondary | ICD-10-CM | POA: Diagnosis not present

## 2016-12-15 DIAGNOSIS — L2089 Other atopic dermatitis: Secondary | ICD-10-CM | POA: Diagnosis not present

## 2017-02-19 DIAGNOSIS — J440 Chronic obstructive pulmonary disease with acute lower respiratory infection: Secondary | ICD-10-CM | POA: Diagnosis not present

## 2017-02-19 DIAGNOSIS — L03112 Cellulitis of left axilla: Secondary | ICD-10-CM | POA: Diagnosis not present

## 2017-02-19 DIAGNOSIS — I10 Essential (primary) hypertension: Secondary | ICD-10-CM | POA: Diagnosis not present

## 2017-02-28 DIAGNOSIS — N6320 Unspecified lump in the left breast, unspecified quadrant: Secondary | ICD-10-CM | POA: Diagnosis not present

## 2017-02-28 DIAGNOSIS — L03112 Cellulitis of left axilla: Secondary | ICD-10-CM | POA: Diagnosis not present

## 2017-02-28 DIAGNOSIS — N6489 Other specified disorders of breast: Secondary | ICD-10-CM | POA: Diagnosis not present

## 2017-02-28 DIAGNOSIS — R928 Other abnormal and inconclusive findings on diagnostic imaging of breast: Secondary | ICD-10-CM | POA: Diagnosis not present

## 2017-05-23 DIAGNOSIS — Z1389 Encounter for screening for other disorder: Secondary | ICD-10-CM | POA: Diagnosis not present

## 2017-05-23 DIAGNOSIS — Z0000 Encounter for general adult medical examination without abnormal findings: Secondary | ICD-10-CM | POA: Diagnosis not present

## 2017-05-23 DIAGNOSIS — J440 Chronic obstructive pulmonary disease with acute lower respiratory infection: Secondary | ICD-10-CM | POA: Diagnosis not present

## 2017-05-23 DIAGNOSIS — I10 Essential (primary) hypertension: Secondary | ICD-10-CM | POA: Diagnosis not present

## 2017-05-24 DIAGNOSIS — R0989 Other specified symptoms and signs involving the circulatory and respiratory systems: Secondary | ICD-10-CM | POA: Diagnosis not present

## 2017-05-24 DIAGNOSIS — R05 Cough: Secondary | ICD-10-CM | POA: Diagnosis not present

## 2017-05-24 DIAGNOSIS — I517 Cardiomegaly: Secondary | ICD-10-CM | POA: Diagnosis not present

## 2017-05-30 DIAGNOSIS — R0602 Shortness of breath: Secondary | ICD-10-CM | POA: Diagnosis not present

## 2017-06-04 DIAGNOSIS — H903 Sensorineural hearing loss, bilateral: Secondary | ICD-10-CM | POA: Diagnosis not present

## 2017-06-11 ENCOUNTER — Encounter: Payer: Self-pay | Admitting: *Deleted

## 2017-06-11 NOTE — Progress Notes (Signed)
Cardiology Office Note  Date: 06/12/2017   ID: Audrey Craig, DOB 26-Apr-1951, MRN [id number]  PCP: Neale Burly, MD  Primary Cardiologist: Rozann Lesches, MD   Chief Complaint  Patient presents with  . Coronary Artery Disease    History of Present Illness: Audrey Craig is a 66 y.o. female last seen in November 2017.  She presents for a routine follow-up visit.  She does not report any definite angina symptoms since last encounter.  She has chronic NYHA class II dyspnea with most activities.  I reviewed her medications which are outlined below.  Current cardiac regimen includes aspirin, Prinzide, Lopressor, and as needed nitroglycerin.  I personally reviewed her ECG today which shows sinus rhythm with low voltage and decreased R wave progression.  Last ischemic assessment was a Agricultural consultant in 2013 which was low risk.  She tells me that she has had progressive hearing loss, has an evaluation at [facility] in January for possible implant surgery.  Past Medical History:  Diagnosis Date  . Coronary atherosclerosis of native coronary artery    PCTA obtuse marginal 5/99 - previous [place] patient  . Hearing loss   . Hyperlipidemia   . Hypertension   . NSTEMI (non-ST elevated myocardial infarction) (Audrey Craig)    1999  . Vision abnormalities     Past Surgical History:  Procedure Laterality Date  . ABDOMINAL HYSTERECTOMY    . BACK SURGERY    . BREAST LUMPECTOMY    . CHOLECYSTECTOMY      Current Outpatient Medications  Medication Sig Dispense Refill  . acetaminophen (TYLENOL) 500 MG tablet Take 500 mg by mouth as needed.    Marland Kitchen aspirin 81 MG tablet Take 81 mg by mouth daily.     . Calcium Carbonate (CALTRATE 600) 1500 MG TABS Take 1 tablet by mouth daily.     . DULoxetine (CYMBALTA) 60 MG capsule Take 1 capsule by mouth 2 (two) times daily.    . ergocalciferol (VITAMIN D2) 50000 UNITS capsule Take 50,000 Units by mouth once a week.      . fish oil-omega-3 fatty acids 1000 MG  capsule Take 3 g by mouth daily.     Marland Kitchen gabapentin (NEURONTIN) 600 MG tablet Take 1/2 to 1 po nightly 30 tablet 11  . lisinopril-hydrochlorothiazide (PRINZIDE,ZESTORETIC) 20-12.5 MG per tablet Take 1 tablet by mouth daily.  0  . metoprolol (LOPRESSOR) 50 MG tablet Take 50 mg daily by mouth.     . Multiple Vitamins-Minerals (SENIOR MULTIVITAMIN PLUS PO) Take 1 tablet by mouth daily.    . nitroGLYCERIN (NITROSTAT) 0.4 MG SL tablet Place 1 tablet (0.4 mg total) under the tongue every 5 (five) minutes as needed. Up to 3 doses. If no relief after 3rd dose, proceed to ED 25 tablet 3  . psyllium (METAMUCIL) 0.52 G capsule Take 0.52 g by mouth daily.    . Tiotropium Bromide-Olodaterol (STIOLTO RESPIMAT) 2.5-2.5 MCG/ACT AERS Inhale into the lungs.     No current facility-administered medications for this visit.    Allergies:  Patient has no known allergies.   Social History: The patient  reports that she has quit smoking. Her smoking use included cigarettes. She started smoking about 5 years ago. She has a 20.00 pack-year smoking history. she has never used smokeless tobacco. She reports that she does not drink alcohol or use drugs.   ROS:  Please see the history of present illness. Otherwise, complete review of systems is positive for hearing loss, right thigh/groin  discomfort and limited range of motion.  All other systems are reviewed and negative.   Physical Exam: VS:  BP 108/62   Pulse 68   Ht 5\' 9"  (1.753 m)   Wt 218 lb (98.9 kg)   SpO2 93%   BMI 32.19 kg/m , BMI Body mass index is 32.19 kg/m.  Wt Readings from Last 3 Encounters:  06/12/17 218 lb (98.9 kg)  05/30/16 209 lb (94.8 kg)  05/27/15 208 lb 12.8 oz (94.7 kg)    General: Obese woman, appears comfortable at rest. HEENT: Conjunctiva and lids normal, oropharynx clear. Neck: Supple, no elevated JVP or carotid bruits, no thyromegaly. Lungs: Diminished breath sounds without wheezing, nonlabored breathing at rest. Cardiac: Regular  rate and rhythm, no S3 or significant systolic murmur, no pericardial rub. Abdomen: Soft, nontender, bowel sounds present, no guarding or rebound. Extremities: No pitting edema, distal pulses 2+. Skin: Warm and dry. Musculoskeletal: No kyphosis. Neuropsychiatric: Alert and oriented x3, affect grossly appropriate.  ECG: I personally reviewed the tracing from 05/30/2016 which showed sinus bradycardia.  Recent Labwork:  October 2017: Cholesterol 192, triglycerides 195, HDL 40, LDL 113, BUN 10, creatinine 0.72, potassium 4.2  Other Studies Reviewed Today:  Lexiscan Cardiolite November 2013: Negative for ischemia with LVEF greater than 70%.  Assessment and Plan:  1.  CAD status post angioplasty of the obtuse marginal in 1999.  She has not undergone follow-up ischemic testing in the last 5 years.  We will arrange a Lexiscan Myoview on medical therapy.  ECG reviewed and stable.  2.  Long-standing tobacco abuse.  We have discussed smoking cessation.  3.  Essential hypertension, blood pressure is well controlled today.  Change from Lopressor to Toprol-XL at 25 mg in the evening.  She is only been taking her evening dose of Lopressor.  4.  Hyperlipidemia, on omega-3 supplements.  Plan to request most recent lab work from Dr. Sherrie Sport.  He is currently not on a statin.  Current medicines were reviewed with the patient today.   Orders Placed This Encounter  Procedures  . NM Myocar Multi W/Spect W/Wall Motion / EF  . EKG 12-Lead    Disposition: Follow-up in 1 year.  Signed, Satira Sark, MD, [facility] 06/12/2017 10:20 AM    [facility] [place], [place], [place] [address] Phone: [phone]; Fax: [phone]

## 2017-06-12 ENCOUNTER — Telehealth: Payer: Self-pay | Admitting: Cardiology

## 2017-06-12 ENCOUNTER — Encounter: Payer: Self-pay | Admitting: Cardiology

## 2017-06-12 ENCOUNTER — Ambulatory Visit: Payer: Medicare Other | Admitting: Cardiology

## 2017-06-12 VITALS — BP 108/62 | HR 68 | Ht 69.0 in | Wt 218.0 lb

## 2017-06-12 DIAGNOSIS — Z720 Tobacco use: Secondary | ICD-10-CM | POA: Diagnosis not present

## 2017-06-12 DIAGNOSIS — E782 Mixed hyperlipidemia: Secondary | ICD-10-CM

## 2017-06-12 DIAGNOSIS — I2510 Atherosclerotic heart disease of native coronary artery without angina pectoris: Secondary | ICD-10-CM | POA: Diagnosis not present

## 2017-06-12 DIAGNOSIS — I10 Essential (primary) hypertension: Secondary | ICD-10-CM | POA: Diagnosis not present

## 2017-06-12 MED ORDER — METOPROLOL SUCCINATE ER 25 MG PO TB24: 25.0000 mg | ORAL_TABLET | Freq: Every day | ORAL | 6 refills | Status: AC

## 2017-06-12 NOTE — Addendum Note (Signed)
Addended by: Acquanetta Chain on: 06/12/2017 10:27 AM   Modules accepted: Orders

## 2017-06-12 NOTE — Telephone Encounter (Signed)
Pre-cert Verification for the following procedure   Lexiscan myoview scheduled for 07-30-17

## 2017-06-12 NOTE — Patient Instructions (Addendum)
Medication Instructions:  Your physician has recommended you make the following change in your medication:   STOP lopressor  START toprol xl 25 mg in the evening  Please continue all other medications as prescribed  Labwork: NONE  Testing/Procedures: Your physician has requested that you have a lexiscan myoview. For further information please visit [email or web address]. Please follow instruction sheet, as given.  Follow-Up: Your physician wants you to follow-up in: [place] DR. Domenic Polite You will receive a reminder letter in the mail two months in advance. If you don't receive a letter, please call our office to schedule the follow-up appointment.  Any Other Special Instructions Will Be Listed Below (If Applicable).  If you need a refill on your cardiac medications before your next appointment, please call your pharmacy.

## 2017-06-20 DIAGNOSIS — H40053 Ocular hypertension, bilateral: Secondary | ICD-10-CM | POA: Diagnosis not present

## 2017-06-20 DIAGNOSIS — H524 Presbyopia: Secondary | ICD-10-CM | POA: Diagnosis not present

## 2017-06-29 DIAGNOSIS — H2512 Age-related nuclear cataract, left eye: Secondary | ICD-10-CM | POA: Diagnosis not present

## 2017-06-29 DIAGNOSIS — H2511 Age-related nuclear cataract, right eye: Secondary | ICD-10-CM | POA: Diagnosis not present

## 2017-07-23 DIAGNOSIS — H25811 Combined forms of age-related cataract, right eye: Secondary | ICD-10-CM | POA: Diagnosis not present

## 2017-07-23 DIAGNOSIS — H2511 Age-related nuclear cataract, right eye: Secondary | ICD-10-CM | POA: Diagnosis not present

## 2017-07-30 ENCOUNTER — Inpatient Hospital Stay (HOSPITAL_COMMUNITY): Admission: RE | Admit: 2017-07-30 | Payer: Medicare Other | Source: Ambulatory Visit

## 2017-07-30 ENCOUNTER — Ambulatory Visit (HOSPITAL_COMMUNITY): Payer: Medicare Other

## 2017-07-30 DIAGNOSIS — J441 Chronic obstructive pulmonary disease with (acute) exacerbation: Secondary | ICD-10-CM | POA: Diagnosis not present

## 2017-08-01 ENCOUNTER — Encounter (HOSPITAL_BASED_OUTPATIENT_CLINIC_OR_DEPARTMENT_OTHER)
Admission: RE | Admit: 2017-08-01 | Discharge: 2017-08-01 | Disposition: A | Discharge status: Home / Self Care | Payer: Medicare Other | Source: Ambulatory Visit | Attending: Cardiology | Admitting: Cardiology

## 2017-08-01 ENCOUNTER — Encounter (HOSPITAL_COMMUNITY)
Admission: RE | Admit: 2017-08-01 | Discharge: 2017-08-01 | Disposition: A | Discharge status: Home / Self Care | Payer: Medicare Other | Source: Ambulatory Visit | Attending: Cardiology | Admitting: Cardiology

## 2017-08-01 DIAGNOSIS — I2510 Atherosclerotic heart disease of native coronary artery without angina pectoris: Secondary | ICD-10-CM | POA: Diagnosis not present

## 2017-08-01 LAB — NM MYOCAR MULTI W/SPECT W/WALL MOTION / EF
CHL CUP NUCLEAR SDS: 0
CHL CUP NUCLEAR SRS: 1
CHL CUP NUCLEAR SSS: 1
CHL CUP RESTING HR STRESS: 71 {beats}/min
LV dias vol: 63 mL (ref 46–106)
LV sys vol: 15 mL
NUC STRESS TID: 1.17
Peak HR: 102 {beats}/min
RATE: 0.39

## 2017-08-01 MED ORDER — TECHNETIUM TC 99M TETROFOSMIN IV KIT
30.0000 | PACK | Freq: Once | INTRAVENOUS | Status: AC | PRN
  Administered 2017-08-01: 30 via INTRAVENOUS

## 2017-08-01 MED ORDER — TECHNETIUM TC 99M TETROFOSMIN IV KIT
10.0000 | PACK | Freq: Once | INTRAVENOUS | Status: AC | PRN
  Administered 2017-08-01: 10 via INTRAVENOUS

## 2017-08-01 MED ORDER — SODIUM CHLORIDE 0.9% FLUSH
INTRAVENOUS | Status: AC
  Administered 2017-08-01: 10 mL via INTRAVENOUS
  Filled 2017-08-01: qty 10

## 2017-08-01 MED ORDER — REGADENOSON 0.4 MG/5ML IV SOLN
INTRAVENOUS | Status: AC
  Administered 2017-08-01: 0.4 mg via INTRAVENOUS
  Filled 2017-08-01: qty 5

## 2017-08-02 ENCOUNTER — Telehealth: Payer: Self-pay

## 2017-08-02 NOTE — Telephone Encounter (Signed)
LMTCB

## 2017-08-02 NOTE — Telephone Encounter (Signed)
-----   Message from Satira Sark, MD sent at 08/02/2017 10:59 AM EST ----- Results reviewed. Please let her know that the stress test was low risk. We will continue with medical therapy and current follow-up plan. A copy of this test should be forwarded to Neale Burly, MD.

## 2017-08-07 NOTE — Telephone Encounter (Signed)
LMTCB on both numbers

## 2017-08-07 NOTE — Telephone Encounter (Signed)
Patient notified. Routed to PCP 

## 2017-08-07 NOTE — Telephone Encounter (Signed)
-----   Message from Acquanetta Chain, LPN sent at 0/93/1121  3:28 PM EST -----   ----- Message ----- From: Satira Sark, MD Sent: 08/02/2017  10:59 AM To: Merlene Laughter, LPN  Results reviewed. Please let her know that the stress test was low risk. We will continue with medical therapy and current follow-up plan. A copy of this test should be forwarded to Neale Burly, MD.

## 2017-08-16 DIAGNOSIS — H903 Sensorineural hearing loss, bilateral: Secondary | ICD-10-CM | POA: Diagnosis not present

## 2017-08-16 DIAGNOSIS — I25119 Atherosclerotic heart disease of native coronary artery with unspecified angina pectoris: Secondary | ICD-10-CM | POA: Diagnosis not present

## 2017-08-16 DIAGNOSIS — R42 Dizziness and giddiness: Secondary | ICD-10-CM | POA: Diagnosis not present

## 2017-08-28 DIAGNOSIS — J441 Chronic obstructive pulmonary disease with (acute) exacerbation: Secondary | ICD-10-CM | POA: Diagnosis not present

## 2017-08-28 DIAGNOSIS — E7849 Other hyperlipidemia: Secondary | ICD-10-CM | POA: Diagnosis not present

## 2017-08-28 DIAGNOSIS — M545 Low back pain: Secondary | ICD-10-CM | POA: Diagnosis not present

## 2017-08-30 DIAGNOSIS — H2512 Age-related nuclear cataract, left eye: Secondary | ICD-10-CM | POA: Diagnosis not present

## 2017-12-04 DIAGNOSIS — E7849 Other hyperlipidemia: Secondary | ICD-10-CM | POA: Diagnosis not present

## 2017-12-04 DIAGNOSIS — J441 Chronic obstructive pulmonary disease with (acute) exacerbation: Secondary | ICD-10-CM | POA: Diagnosis not present

## 2017-12-04 DIAGNOSIS — M545 Low back pain: Secondary | ICD-10-CM | POA: Diagnosis not present

## 2018-03-13 DIAGNOSIS — M545 Low back pain: Secondary | ICD-10-CM | POA: Diagnosis not present

## 2018-03-13 DIAGNOSIS — Z1389 Encounter for screening for other disorder: Secondary | ICD-10-CM | POA: Diagnosis not present

## 2018-03-13 DIAGNOSIS — E7849 Other hyperlipidemia: Secondary | ICD-10-CM | POA: Diagnosis not present

## 2018-03-13 DIAGNOSIS — J441 Chronic obstructive pulmonary disease with (acute) exacerbation: Secondary | ICD-10-CM | POA: Diagnosis not present

## 2018-03-13 DIAGNOSIS — Z0000 Encounter for general adult medical examination without abnormal findings: Secondary | ICD-10-CM | POA: Diagnosis not present

## 2018-04-16 DIAGNOSIS — M170 Bilateral primary osteoarthritis of knee: Secondary | ICD-10-CM | POA: Diagnosis not present

## 2018-04-18 DIAGNOSIS — M810 Age-related osteoporosis without current pathological fracture: Secondary | ICD-10-CM | POA: Diagnosis not present

## 2018-04-23 DIAGNOSIS — M170 Bilateral primary osteoarthritis of knee: Secondary | ICD-10-CM | POA: Diagnosis not present

## 2018-04-23 DIAGNOSIS — M5416 Radiculopathy, lumbar region: Secondary | ICD-10-CM | POA: Diagnosis not present

## 2018-04-23 DIAGNOSIS — R262 Difficulty in walking, not elsewhere classified: Secondary | ICD-10-CM | POA: Diagnosis not present

## 2018-05-17 DIAGNOSIS — M1611 Unilateral primary osteoarthritis, right hip: Secondary | ICD-10-CM | POA: Diagnosis not present

## 2018-06-03 DIAGNOSIS — M1611 Unilateral primary osteoarthritis, right hip: Secondary | ICD-10-CM | POA: Diagnosis not present

## 2018-06-03 DIAGNOSIS — M25551 Pain in right hip: Secondary | ICD-10-CM | POA: Diagnosis not present

## 2018-06-14 ENCOUNTER — Encounter: Payer: Self-pay | Admitting: Cardiology

## 2018-06-14 ENCOUNTER — Ambulatory Visit: Payer: Medicare Other | Admitting: Cardiology

## 2018-06-14 VITALS — BP 118/68 | HR 75 | Wt 215.8 lb

## 2018-06-14 DIAGNOSIS — I10 Essential (primary) hypertension: Secondary | ICD-10-CM

## 2018-06-14 DIAGNOSIS — I25119 Atherosclerotic heart disease of native coronary artery with unspecified angina pectoris: Secondary | ICD-10-CM | POA: Diagnosis not present

## 2018-06-14 DIAGNOSIS — Z720 Tobacco use: Secondary | ICD-10-CM | POA: Diagnosis not present

## 2018-06-14 DIAGNOSIS — E782 Mixed hyperlipidemia: Secondary | ICD-10-CM

## 2018-06-14 NOTE — Progress Notes (Signed)
Cardiology Office Note  Date: 06/14/2018   ID: Audrey Craig, DOB 03-25-1951, MRN [id number]  PCP: Neale Burly, MD  Primary Cardiologist: Rozann Lesches, MD   Chief Complaint  Patient presents with  . Coronary Artery Disease    History of Present Illness: Audrey Craig is a 67 y.o. female last seen in November 2018.  She is here for a routine follow-up visit.  She does not report any angina symptoms or nitroglycerin use, no increasing shortness of breath with typical activities.  Follow-up Myoview in January of this year was low risk, no evidence of ischemia and normal LVEF.  Current cardiac regimen includes aspirin, Lipitor, Prinzide, Toprol-XL, and as needed nitroglycerin.  She states that she is having a follow-up visit with Dr. Sherrie Sport next week.  I personally reviewed her ECG today which shows sinus rhythm with lead motion artifact and decreased R wave progression.  Past Medical History:  Diagnosis Date  . Coronary atherosclerosis of native coronary artery    PCTA obtuse marginal 5/99 - previous [place] patient  . Hearing loss   . Hyperlipidemia   . Hypertension   . NSTEMI (non-ST elevated myocardial infarction) ([place])    1999  . Vision abnormalities     Past Surgical History:  Procedure Laterality Date  . ABDOMINAL HYSTERECTOMY    . BACK SURGERY    . BREAST LUMPECTOMY    . CHOLECYSTECTOMY      Current Outpatient Medications  Medication Sig Dispense Refill  . acetaminophen (TYLENOL) 500 MG tablet Take 500 mg by mouth as needed.    Marland Kitchen aspirin 81 MG tablet Take 81 mg by mouth daily.     Marland Kitchen atorvastatin (LIPITOR) 40 MG tablet Take 1 tablet by mouth daily.    . Calcium Carbonate (CALTRATE 600) 1500 MG TABS Take 1 tablet by mouth daily.     . Cranberry (RA CRANBERRY) 500 MG CAPS Take 1 capsule by mouth daily.    . DULoxetine (CYMBALTA) 60 MG capsule Take 1 capsule by mouth 2 (two) times daily.    . ergocalciferol (VITAMIN D2) 50000 UNITS capsule Take 50,000 Units  by mouth once a week.      . fish oil-omega-3 fatty acids 1000 MG capsule Take 3 g by mouth daily.     . Fluticasone-Umeclidin-Vilant (TRELEGY ELLIPTA) 100-62.5-25 MCG/INH AEPB Inhale 1 puff into the lungs every morning.    . gabapentin (NEURONTIN) 300 MG capsule Take 1 capsule by mouth 2 (two) times daily.    Marland Kitchen lisinopril-hydrochlorothiazide (PRINZIDE,ZESTORETIC) 20-12.5 MG per tablet Take 1 tablet by mouth daily.  0  . loratadine (CLARITIN) 10 MG tablet Take 10 mg by mouth daily.    . metoprolol succinate (TOPROL XL) 25 MG 24 hr tablet Take 1 tablet (25 mg total) by mouth daily. 30 tablet 6  . Multiple Vitamins-Minerals (SENIOR MULTIVITAMIN PLUS PO) Take 1 tablet by mouth daily.    . nitroGLYCERIN (NITROSTAT) 0.4 MG SL tablet Place 1 tablet (0.4 mg total) under the tongue every 5 (five) minutes as needed. Up to 3 doses. If no relief after 3rd dose, proceed to ED 25 tablet 3  . psyllium (METAMUCIL) 0.52 G capsule Take 0.52 g by mouth daily.    . Tiotropium Bromide-Olodaterol (STIOLTO RESPIMAT) 2.5-2.5 MCG/ACT AERS Inhale into the lungs.     No current facility-administered medications for this visit.    Allergies:  Patient has no known allergies.   Social History: The patient  reports that she has quit  smoking. Her smoking use included cigarettes. She started smoking about 6 years ago. She has a 20.00 pack-year smoking history. She has never used smokeless tobacco. She reports that she does not drink alcohol or use drugs.   ROS:  Please see the history of present illness. Otherwise, complete review of systems is positive for hearing loss.  All other systems are reviewed and negative.   Physical Exam: VS:  BP 118/68   Pulse 75   Wt 215 lb 12.8 oz (97.9 kg)   SpO2 94%   BMI 31.87 kg/m , BMI Body mass index is 31.87 kg/m.  Wt Readings from Last 3 Encounters:  06/14/18 215 lb 12.8 oz (97.9 kg)  06/12/17 218 lb (98.9 kg)  05/30/16 209 lb (94.8 kg)    General: Patient appears comfortable  at rest. HEENT: Conjunctiva and lids normal, oropharynx clear. Neck: Supple, no elevated JVP or carotid bruits, no thyromegaly. Lungs: Decreased breath sounds without wheezing, nonlabored breathing at rest. Cardiac: Regular rate and rhythm, no S3 or significant systolic murmur. Abdomen: Soft, nontender, bowel sounds present. Extremities: No pitting edema, distal pulses 2+. Skin: Warm and dry. Musculoskeletal: No kyphosis. Neuropsychiatric: Alert and oriented x3, affect grossly appropriate.  ECG: I personally reviewed the tracing from 06/12/2017 which showed sinus rhythm with low voltage and decreased R wave progression.  Recent Labwork:  October 2017: Cholesterol 192, triglycerides 195, HDL 40, LDL 113, BUN 10, creatinine 0.72, potassium 4.2  Other Studies Reviewed Today:  Lexiscan Myoview 08/01/2017:  There was no ST segment deviation noted during stress.  The study is normal. There are no perfusion defects  This is a low risk study.  The left ventricular ejection fraction is hyperdynamic (>65%).  Assessment and Plan:  1.  CAD with history of angioplasty to the obtuse marginal in 1999.  Follow-up ischemic testing from earlier in the year was low risk.  She has been symptomatically stable on medical therapy.  Continue aspirin and statin.  2.  Essential hypertension, blood pressure is well controlled today.  Keep follow-up with PCP.  3.  Mixed hyperlipidemia, on Lipitor with follow-up per PCP.  4.  Intermittent tobacco use.  We have discussed smoking cessation.  Current medicines were reviewed with the patient today.   Orders Placed This Encounter  Procedures  . EKG 12-Lead    Disposition: Follow-up in 1 year.  Signed, Satira Sark, MD, [facility] 06/14/2018 1:25 PM    [facility] [place], [place], [place] [address] Phone: [phone]; Fax: [phone]

## 2018-06-14 NOTE — Patient Instructions (Addendum)

## 2018-06-17 DIAGNOSIS — Z0000 Encounter for general adult medical examination without abnormal findings: Secondary | ICD-10-CM | POA: Diagnosis not present

## 2018-06-17 DIAGNOSIS — E7849 Other hyperlipidemia: Secondary | ICD-10-CM | POA: Diagnosis not present

## 2018-06-17 DIAGNOSIS — J449 Chronic obstructive pulmonary disease, unspecified: Secondary | ICD-10-CM | POA: Diagnosis not present

## 2018-06-23 DIAGNOSIS — S299XXA Unspecified injury of thorax, initial encounter: Secondary | ICD-10-CM | POA: Diagnosis not present

## 2018-06-23 DIAGNOSIS — Z79899 Other long term (current) drug therapy: Secondary | ICD-10-CM | POA: Diagnosis not present

## 2018-06-23 DIAGNOSIS — M1611 Unilateral primary osteoarthritis, right hip: Secondary | ICD-10-CM | POA: Diagnosis not present

## 2018-06-23 DIAGNOSIS — M25551 Pain in right hip: Secondary | ICD-10-CM | POA: Diagnosis not present

## 2018-06-23 DIAGNOSIS — M545 Low back pain: Secondary | ICD-10-CM | POA: Diagnosis not present

## 2018-06-23 DIAGNOSIS — W01198A Fall on same level from slipping, tripping and stumbling with subsequent striking against other object, initial encounter: Secondary | ICD-10-CM | POA: Diagnosis not present

## 2018-06-23 DIAGNOSIS — M533 Sacrococcygeal disorders, not elsewhere classified: Secondary | ICD-10-CM | POA: Diagnosis not present

## 2018-06-23 DIAGNOSIS — S3091XA Unspecified superficial injury of lower back and pelvis, initial encounter: Secondary | ICD-10-CM | POA: Diagnosis not present

## 2018-06-23 DIAGNOSIS — S20211A Contusion of right front wall of thorax, initial encounter: Secondary | ICD-10-CM | POA: Diagnosis not present

## 2018-06-23 DIAGNOSIS — S79911A Unspecified injury of right hip, initial encounter: Secondary | ICD-10-CM | POA: Diagnosis not present

## 2018-06-23 DIAGNOSIS — S20221A Contusion of right back wall of thorax, initial encounter: Secondary | ICD-10-CM | POA: Diagnosis not present

## 2018-06-23 DIAGNOSIS — M546 Pain in thoracic spine: Secondary | ICD-10-CM | POA: Diagnosis not present

## 2018-06-28 DIAGNOSIS — W19XXXA Unspecified fall, initial encounter: Secondary | ICD-10-CM | POA: Diagnosis not present

## 2018-06-28 DIAGNOSIS — M1611 Unilateral primary osteoarthritis, right hip: Secondary | ICD-10-CM | POA: Diagnosis not present

## 2018-07-04 DIAGNOSIS — Z1231 Encounter for screening mammogram for malignant neoplasm of breast: Secondary | ICD-10-CM | POA: Diagnosis not present

## 2018-08-19 DIAGNOSIS — Z1211 Encounter for screening for malignant neoplasm of colon: Secondary | ICD-10-CM | POA: Diagnosis not present

## 2018-09-12 DIAGNOSIS — D126 Benign neoplasm of colon, unspecified: Secondary | ICD-10-CM | POA: Diagnosis not present

## 2018-09-12 DIAGNOSIS — G629 Polyneuropathy, unspecified: Secondary | ICD-10-CM | POA: Diagnosis not present

## 2018-09-12 DIAGNOSIS — M797 Fibromyalgia: Secondary | ICD-10-CM | POA: Diagnosis not present

## 2018-09-12 DIAGNOSIS — R195 Other fecal abnormalities: Secondary | ICD-10-CM | POA: Diagnosis not present

## 2018-09-12 DIAGNOSIS — K635 Polyp of colon: Secondary | ICD-10-CM | POA: Diagnosis not present

## 2018-09-12 DIAGNOSIS — Z955 Presence of coronary angioplasty implant and graft: Secondary | ICD-10-CM | POA: Diagnosis not present

## 2018-09-12 DIAGNOSIS — K921 Melena: Secondary | ICD-10-CM | POA: Diagnosis not present

## 2018-09-12 DIAGNOSIS — J449 Chronic obstructive pulmonary disease, unspecified: Secondary | ICD-10-CM | POA: Diagnosis not present

## 2018-09-12 DIAGNOSIS — Z7982 Long term (current) use of aspirin: Secondary | ICD-10-CM | POA: Diagnosis not present

## 2018-09-12 DIAGNOSIS — I2510 Atherosclerotic heart disease of native coronary artery without angina pectoris: Secondary | ICD-10-CM | POA: Diagnosis not present

## 2018-09-12 DIAGNOSIS — Z79899 Other long term (current) drug therapy: Secondary | ICD-10-CM | POA: Diagnosis not present

## 2018-09-12 DIAGNOSIS — I252 Old myocardial infarction: Secondary | ICD-10-CM | POA: Diagnosis not present

## 2018-09-12 DIAGNOSIS — Z7951 Long term (current) use of inhaled steroids: Secondary | ICD-10-CM | POA: Diagnosis not present

## 2018-09-12 DIAGNOSIS — Z9049 Acquired absence of other specified parts of digestive tract: Secondary | ICD-10-CM | POA: Diagnosis not present

## 2018-09-12 DIAGNOSIS — I10 Essential (primary) hypertension: Secondary | ICD-10-CM | POA: Diagnosis not present

## 2018-09-12 DIAGNOSIS — E785 Hyperlipidemia, unspecified: Secondary | ICD-10-CM | POA: Diagnosis not present

## 2018-09-23 DIAGNOSIS — J449 Chronic obstructive pulmonary disease, unspecified: Secondary | ICD-10-CM | POA: Diagnosis not present

## 2018-09-23 DIAGNOSIS — Z1389 Encounter for screening for other disorder: Secondary | ICD-10-CM | POA: Diagnosis not present

## 2018-09-23 DIAGNOSIS — E7849 Other hyperlipidemia: Secondary | ICD-10-CM | POA: Diagnosis not present

## 2018-09-23 DIAGNOSIS — Z0000 Encounter for general adult medical examination without abnormal findings: Secondary | ICD-10-CM | POA: Diagnosis not present

## 2018-10-09 DIAGNOSIS — D126 Benign neoplasm of colon, unspecified: Secondary | ICD-10-CM | POA: Diagnosis not present

## 2018-12-25 DIAGNOSIS — Z0000 Encounter for general adult medical examination without abnormal findings: Secondary | ICD-10-CM | POA: Diagnosis not present

## 2018-12-25 DIAGNOSIS — I10 Essential (primary) hypertension: Secondary | ICD-10-CM | POA: Diagnosis not present

## 2018-12-25 DIAGNOSIS — J449 Chronic obstructive pulmonary disease, unspecified: Secondary | ICD-10-CM | POA: Diagnosis not present

## 2018-12-25 DIAGNOSIS — E7849 Other hyperlipidemia: Secondary | ICD-10-CM | POA: Diagnosis not present

## 2019-02-20 ENCOUNTER — Other Ambulatory Visit: Payer: Self-pay

## 2019-02-20 NOTE — Patient Outreach (Signed)
[facility]) Care Management  02/20/2019  [name] 03-28-1951 [id number]   Medication Adherence call to Audrey Craig HIPPA Compliant Voice message left with a call back number. Audrey Craig is showing past due on Lisinopril/Hctz 10/12.5 mg and Atorvastatin 40 mg under [place].   [facility] Management Direct Dial [phone]  Fax [phone] [email or web address]

## 2019-04-03 DIAGNOSIS — M545 Low back pain: Secondary | ICD-10-CM | POA: Diagnosis not present

## 2019-04-03 DIAGNOSIS — J449 Chronic obstructive pulmonary disease, unspecified: Secondary | ICD-10-CM | POA: Diagnosis not present

## 2019-04-03 DIAGNOSIS — I10 Essential (primary) hypertension: Secondary | ICD-10-CM | POA: Diagnosis not present

## 2019-04-03 DIAGNOSIS — E782 Mixed hyperlipidemia: Secondary | ICD-10-CM | POA: Diagnosis not present

## 2019-07-07 DIAGNOSIS — I10 Essential (primary) hypertension: Secondary | ICD-10-CM | POA: Diagnosis not present

## 2019-07-07 DIAGNOSIS — M545 Low back pain: Secondary | ICD-10-CM | POA: Diagnosis not present

## 2019-07-07 DIAGNOSIS — E782 Mixed hyperlipidemia: Secondary | ICD-10-CM | POA: Diagnosis not present

## 2019-07-07 DIAGNOSIS — J449 Chronic obstructive pulmonary disease, unspecified: Secondary | ICD-10-CM | POA: Diagnosis not present

## 2019-10-07 DIAGNOSIS — I10 Essential (primary) hypertension: Secondary | ICD-10-CM | POA: Diagnosis not present

## 2019-10-07 DIAGNOSIS — J449 Chronic obstructive pulmonary disease, unspecified: Secondary | ICD-10-CM | POA: Diagnosis not present

## 2019-10-07 DIAGNOSIS — Z131 Encounter for screening for diabetes mellitus: Secondary | ICD-10-CM | POA: Diagnosis not present

## 2019-10-07 DIAGNOSIS — E782 Mixed hyperlipidemia: Secondary | ICD-10-CM | POA: Diagnosis not present

## 2019-10-07 DIAGNOSIS — M545 Low back pain: Secondary | ICD-10-CM | POA: Diagnosis not present

## 2019-10-07 DIAGNOSIS — Z0000 Encounter for general adult medical examination without abnormal findings: Secondary | ICD-10-CM | POA: Diagnosis not present

## 2019-10-09 ENCOUNTER — Encounter: Payer: Self-pay | Admitting: *Deleted

## 2019-10-09 ENCOUNTER — Encounter: Payer: Self-pay | Admitting: Cardiology

## 2019-10-09 ENCOUNTER — Ambulatory Visit: Payer: Medicare Other | Admitting: Cardiology

## 2019-10-09 ENCOUNTER — Other Ambulatory Visit: Payer: Self-pay

## 2019-10-09 VITALS — BP 112/62 | HR 82 | Ht 68.0 in | Wt 218.0 lb

## 2019-10-09 DIAGNOSIS — E782 Mixed hyperlipidemia: Secondary | ICD-10-CM | POA: Diagnosis not present

## 2019-10-09 DIAGNOSIS — I25119 Atherosclerotic heart disease of native coronary artery with unspecified angina pectoris: Secondary | ICD-10-CM

## 2019-10-09 DIAGNOSIS — I10 Essential (primary) hypertension: Secondary | ICD-10-CM

## 2019-10-09 NOTE — Patient Instructions (Signed)

## 2019-10-09 NOTE — Progress Notes (Signed)
Cardiology Office Note  Date: 10/09/2019   ID: Audrey Craig, DOB [birth year], MRN [id number]  PCP:  Neale Burly, MD  Cardiologist:  Rozann Lesches, MD Electrophysiologist:  None   Chief Complaint  Patient presents with  . Cardiac follow-up    History of Present Illness: Audrey Craig is a 69 y.o. female last seen in November 2019.  She presents for a follow-up visit.  She does not report any obvious angina symptoms or nitroglycerin use.  States that she has had occasional episodes of lightheadedness, has questioned fluctuation in blood sugar and underwent recent lab work with Dr. Sherrie Sport.  She does not describe any frank palpitations or syncope.  I reviewed her medications which are outlined below.  Current cardiac regimen includes aspirin, Lipitor, Prinzide, Toprol-XL, and as needed nitroglycerin.  I personally reviewed her ECG today which shows sinus rhythm with lead motion artifact.  Past Medical History:  Diagnosis Date  . Coronary atherosclerosis of native coronary artery    PCTA obtuse marginal 5/99 - previous [place] patient  . Hearing loss   . Hyperlipidemia   . Hypertension   . NSTEMI (non-ST elevated myocardial infarction) ([place])    1999  . Vision abnormalities     Past Surgical History:  Procedure Laterality Date  . ABDOMINAL HYSTERECTOMY    . BACK SURGERY    . BREAST LUMPECTOMY    . CHOLECYSTECTOMY      Current Outpatient Medications  Medication Sig Dispense Refill  . acetaminophen (TYLENOL) 500 MG tablet Take 500 mg by mouth as needed.    Marland Kitchen aspirin 81 MG tablet Take 81 mg by mouth daily.     Marland Kitchen atorvastatin (LIPITOR) 40 MG tablet Take 1 tablet by mouth daily.    . Calcium Carbonate (CALTRATE 600) 1500 MG TABS Take 1 tablet by mouth daily.     . Cranberry (RA CRANBERRY) 500 MG CAPS Take 1 capsule by mouth daily.    . DULoxetine (CYMBALTA) 60 MG capsule Take 1 capsule by mouth 2 (two) times daily.    . ergocalciferol (VITAMIN D2) 50000 UNITS capsule  Take 50,000 Units by mouth once a week.      . fish oil-omega-3 fatty acids 1000 MG capsule Take 3 g by mouth daily.     . Fluticasone-Umeclidin-Vilant (TRELEGY ELLIPTA) 100-62.5-25 MCG/INH AEPB Inhale 1 puff into the lungs every morning.    . gabapentin (NEURONTIN) 300 MG capsule Take 1 capsule by mouth 2 (two) times daily.    Marland Kitchen lisinopril-hydrochlorothiazide (PRINZIDE,ZESTORETIC) 20-12.5 MG per tablet Take 1 tablet by mouth daily.  0  . loratadine (CLARITIN) 10 MG tablet Take 10 mg by mouth daily.    . metoprolol succinate (TOPROL XL) 25 MG 24 hr tablet Take 1 tablet (25 mg total) by mouth daily. 30 tablet 6  . Multiple Vitamins-Minerals (SENIOR MULTIVITAMIN PLUS PO) Take 1 tablet by mouth daily.    . nitroGLYCERIN (NITROSTAT) 0.4 MG SL tablet Place 1 tablet (0.4 mg total) under the tongue every 5 (five) minutes as needed. Up to 3 doses. If no relief after 3rd dose, proceed to ED 25 tablet 3  . psyllium (METAMUCIL) 0.52 G capsule Take 0.52 g by mouth daily.    . Tiotropium Bromide-Olodaterol (STIOLTO RESPIMAT) 2.5-2.5 MCG/ACT AERS Inhale into the lungs.     No current facility-administered medications for this visit.   Allergies:  Patient has no known allergies.   ROS:   No syncope.  Physical Exam: VS:  BP 112/62  Pulse 82   Ht 5\' 8"  (1.727 m)   Wt 218 lb (98.9 kg)   SpO2 96%   BMI 33.15 kg/m , BMI Body mass index is 33.15 kg/m.  Wt Readings from Last 3 Encounters:  10/09/19 218 lb (98.9 kg)  06/14/18 215 lb 12.8 oz (97.9 kg)  06/12/17 218 lb (98.9 kg)    General: Patient appears comfortable at rest. HEENT: Conjunctiva and lids normal, wearing a mask. Neck: Supple, no elevated JVP or carotid bruits, no thyromegaly. Lungs: Decreased breath sounds without active wheezing, nonlabored breathing at rest. Cardiac: Regular rate and rhythm, no S3 or significant systolic murmur, no pericardial rub. Abdomen: Soft, nontender, bowel sounds present. Extremities: No pitting edema, distal  pulses 2+.  ECG:  An ECG dated 06/14/2018 was personally reviewed today and demonstrated:  Sinus rhythm with lead motion artifact and decreased R wave progression.  Recent Labwork:  No interval lab work for review today.  Other Studies Reviewed Today:  Lexiscan Myoview 08/01/2017:  There was no ST segment deviation noted during stress.  The study is normal. There are no perfusion defects  This is a low risk study.  The left ventricular ejection fraction is hyperdynamic (>65%).  Assessment and Plan:  1.  CAD status post angioplasty of the obtuse marginal in 1999.  We continue medical therapy and observation in the absence of progressive angina symptoms.  Follow-up Myoview in 2019 was low risk.  ECG reviewed and stable.  Continue aspirin, Lipitor, Toprol-XL, and Prinzide.  She has as needed nitroglycerin available.  2.  Mixed hyperlipidemia, she continues on Lipitor with follow-up by PCP.  Requesting interval lab work.  Medication Adjustments/Labs and Tests Ordered: Current medicines are reviewed at length with the patient today.  Concerns regarding medicines are outlined above.   Tests Ordered: Orders Placed This Encounter  Procedures  . EKG 12-Lead    Medication Changes: No orders of the defined types were placed in this encounter.   Disposition:  Follow up 1 year in the [place] office.  Signed, Satira Sark, MD, [facility] 10/09/2019 4:37 PM    [facility] [place], [place], [place] [address] Phone: [phone]; Fax: [phone]

## 2020-01-20 DIAGNOSIS — J449 Chronic obstructive pulmonary disease, unspecified: Secondary | ICD-10-CM | POA: Diagnosis not present

## 2020-01-20 DIAGNOSIS — M545 Low back pain: Secondary | ICD-10-CM | POA: Diagnosis not present

## 2020-01-20 DIAGNOSIS — I10 Essential (primary) hypertension: Secondary | ICD-10-CM | POA: Diagnosis not present

## 2020-01-20 DIAGNOSIS — Z0000 Encounter for general adult medical examination without abnormal findings: Secondary | ICD-10-CM | POA: Diagnosis not present

## 2020-01-20 DIAGNOSIS — E782 Mixed hyperlipidemia: Secondary | ICD-10-CM | POA: Diagnosis not present

## 2020-04-05 DIAGNOSIS — Z1231 Encounter for screening mammogram for malignant neoplasm of breast: Secondary | ICD-10-CM | POA: Diagnosis not present

## 2020-04-05 DIAGNOSIS — M810 Age-related osteoporosis without current pathological fracture: Secondary | ICD-10-CM | POA: Diagnosis not present

## 2020-04-22 DIAGNOSIS — J449 Chronic obstructive pulmonary disease, unspecified: Secondary | ICD-10-CM | POA: Diagnosis not present

## 2020-04-22 DIAGNOSIS — I10 Essential (primary) hypertension: Secondary | ICD-10-CM | POA: Diagnosis not present

## 2020-04-22 DIAGNOSIS — M545 Low back pain: Secondary | ICD-10-CM | POA: Diagnosis not present

## 2020-04-22 DIAGNOSIS — Z0000 Encounter for general adult medical examination without abnormal findings: Secondary | ICD-10-CM | POA: Diagnosis not present

## 2020-04-22 DIAGNOSIS — E782 Mixed hyperlipidemia: Secondary | ICD-10-CM | POA: Diagnosis not present

## 2020-07-01 DIAGNOSIS — Z13820 Encounter for screening for osteoporosis: Secondary | ICD-10-CM | POA: Diagnosis not present

## 2020-07-01 DIAGNOSIS — M810 Age-related osteoporosis without current pathological fracture: Secondary | ICD-10-CM | POA: Diagnosis not present

## 2020-07-01 DIAGNOSIS — M85851 Other specified disorders of bone density and structure, right thigh: Secondary | ICD-10-CM | POA: Diagnosis not present

## 2020-08-03 DIAGNOSIS — M545 Low back pain, unspecified: Secondary | ICD-10-CM | POA: Diagnosis not present

## 2020-08-03 DIAGNOSIS — J440 Chronic obstructive pulmonary disease with acute lower respiratory infection: Secondary | ICD-10-CM | POA: Diagnosis not present

## 2020-08-03 DIAGNOSIS — E782 Mixed hyperlipidemia: Secondary | ICD-10-CM | POA: Diagnosis not present

## 2020-08-03 DIAGNOSIS — I10 Essential (primary) hypertension: Secondary | ICD-10-CM | POA: Diagnosis not present

## 2020-11-04 DIAGNOSIS — J440 Chronic obstructive pulmonary disease with acute lower respiratory infection: Secondary | ICD-10-CM | POA: Diagnosis not present

## 2020-11-04 DIAGNOSIS — E782 Mixed hyperlipidemia: Secondary | ICD-10-CM | POA: Diagnosis not present

## 2020-11-04 DIAGNOSIS — M545 Low back pain, unspecified: Secondary | ICD-10-CM | POA: Diagnosis not present

## 2020-11-04 DIAGNOSIS — Z0000 Encounter for general adult medical examination without abnormal findings: Secondary | ICD-10-CM | POA: Diagnosis not present

## 2020-11-04 DIAGNOSIS — I10 Essential (primary) hypertension: Secondary | ICD-10-CM | POA: Diagnosis not present

## 2020-12-20 DIAGNOSIS — M545 Low back pain, unspecified: Secondary | ICD-10-CM | POA: Diagnosis not present

## 2020-12-20 DIAGNOSIS — I10 Essential (primary) hypertension: Secondary | ICD-10-CM | POA: Diagnosis not present

## 2020-12-20 DIAGNOSIS — E782 Mixed hyperlipidemia: Secondary | ICD-10-CM | POA: Diagnosis not present

## 2021-02-04 ENCOUNTER — Ambulatory Visit: Payer: Medicare Other | Admitting: Cardiology

## 2021-02-04 NOTE — Progress Notes (Deleted)
Cardiology Office Note  Date: 02/04/2021   ID: Audrey Craig, DOB 1950/08/12, MRN [id number]  PCP:  Neale Burly, MD  Cardiologist:  Rozann Lesches, MD Electrophysiologist:  None   No chief complaint on file.   History of Present Illness: Audrey Craig is a 70 y.o. female last seen in March 2021.  Past Medical History:  Diagnosis Date   Coronary atherosclerosis of native coronary artery    PCTA obtuse marginal 5/99 - previous [facility] patient   Hearing loss    Hyperlipidemia    Hypertension    NSTEMI (non-ST elevated myocardial infarction) ([place])    1999   Vision abnormalities     Past Surgical History:  Procedure Laterality Date   ABDOMINAL HYSTERECTOMY     BACK SURGERY     BREAST LUMPECTOMY     CHOLECYSTECTOMY      Current Outpatient Medications  Medication Sig Dispense Refill   acetaminophen (TYLENOL) 500 MG tablet Take 500 mg by mouth as needed.     aspirin 81 MG tablet Take 81 mg by mouth daily.      atorvastatin (LIPITOR) 40 MG tablet Take 1 tablet by mouth daily.     Calcium Carbonate (CALTRATE 600) 1500 MG TABS Take 1 tablet by mouth daily.      Cranberry (RA CRANBERRY) 500 MG CAPS Take 1 capsule by mouth daily.     DULoxetine (CYMBALTA) 60 MG capsule Take 1 capsule by mouth 2 (two) times daily.     ergocalciferol (VITAMIN D2) 50000 UNITS capsule Take 50,000 Units by mouth once a week.       fish oil-omega-3 fatty acids 1000 MG capsule Take 3 g by mouth daily.      Fluticasone-Umeclidin-Vilant (TRELEGY ELLIPTA) 100-62.5-25 MCG/INH AEPB Inhale 1 puff into the lungs every morning.     gabapentin (NEURONTIN) 300 MG capsule Take 1 capsule by mouth 2 (two) times daily.     lisinopril-hydrochlorothiazide (PRINZIDE,ZESTORETIC) 20-12.5 MG per tablet Take 1 tablet by mouth daily.  0   loratadine (CLARITIN) 10 MG tablet Take 10 mg by mouth daily.     metoprolol succinate (TOPROL XL) 25 MG 24 hr tablet Take 1 tablet (25 mg total) by mouth daily. 30 tablet 6    Multiple Vitamins-Minerals (SENIOR MULTIVITAMIN PLUS PO) Take 1 tablet by mouth daily.     nitroGLYCERIN (NITROSTAT) 0.4 MG SL tablet Place 1 tablet (0.4 mg total) under the tongue every 5 (five) minutes as needed. Up to 3 doses. If no relief after 3rd dose, proceed to ED 25 tablet 3   psyllium (METAMUCIL) 0.52 G capsule Take 0.52 g by mouth daily.     Tiotropium Bromide-Olodaterol (STIOLTO RESPIMAT) 2.5-2.5 MCG/ACT AERS Inhale into the lungs.     No current facility-administered medications for this visit.   Allergies:  Patient has no known allergies.   Social History: The patient  reports that she has quit smoking. Her smoking use included cigarettes. She started smoking about 8 years ago. She has a 20.00 pack-year smoking history. She has never used smokeless tobacco. She reports that she does not drink alcohol and does not use drugs.   Family History: The patient's family history includes COPD in her father; Diabetes type II in her mother; Heart disease in her father.   ROS:  Please see the history of present illness. Otherwise, complete review of systems is positive for {NONE DEFAULTED:18576}.  All other systems are reviewed and negative.   Physical Exam: VS:  There were no vitals taken for this visit., BMI There is no height or weight on file to calculate BMI.  Wt Readings from Last 3 Encounters:  10/09/19 218 lb (98.9 kg)  06/14/18 215 lb 12.8 oz (97.9 kg)  06/12/17 218 lb (98.9 kg)    General: Patient appears comfortable at rest. HEENT: Conjunctiva and lids normal, oropharynx clear with moist mucosa. Neck: Supple, no elevated JVP or carotid bruits, no thyromegaly. Lungs: Clear to auscultation, nonlabored breathing at rest. Cardiac: Regular rate and rhythm, no S3 or significant systolic murmur, no pericardial rub. Abdomen: Soft, nontender, no hepatomegaly, bowel sounds present, no guarding or rebound. Extremities: No pitting edema, distal pulses 2+. Skin: Warm and  dry. Musculoskeletal: No kyphosis. Neuropsychiatric: Alert and oriented x3, affect grossly appropriate.  ECG:  An ECG dated 10/09/2019 was personally reviewed today and demonstrated:  Sinus rhythm with lead motion artifact.  Recent Labwork:  No recent lab work for review.  Other Studies Reviewed Today:  Lexiscan Myoview 08/01/2017: There was no ST segment deviation noted during stress. The study is normal. There are no perfusion defects This is a low risk study. The left ventricular ejection fraction is hyperdynamic (>65%).  Assessment and Plan:    Medication Adjustments/Labs and Tests Ordered: Current medicines are reviewed at length with the patient today.  Concerns regarding medicines are outlined above.   Tests Ordered: No orders of the defined types were placed in this encounter.   Medication Changes: No orders of the defined types were placed in this encounter.   Disposition:  Follow up {follow up:15908}  Signed, Satira Sark, MD, [facility] 02/04/2021 12:54 PM    [place] at [place], [place], [place] [address] Phone: [phone]; Fax: [phone]

## 2021-02-09 DIAGNOSIS — Z0000 Encounter for general adult medical examination without abnormal findings: Secondary | ICD-10-CM | POA: Diagnosis not present

## 2021-02-09 DIAGNOSIS — I10 Essential (primary) hypertension: Secondary | ICD-10-CM | POA: Diagnosis not present

## 2021-02-09 DIAGNOSIS — E782 Mixed hyperlipidemia: Secondary | ICD-10-CM | POA: Diagnosis not present

## 2021-02-09 DIAGNOSIS — J440 Chronic obstructive pulmonary disease with acute lower respiratory infection: Secondary | ICD-10-CM | POA: Diagnosis not present

## 2021-02-09 DIAGNOSIS — M545 Low back pain, unspecified: Secondary | ICD-10-CM | POA: Diagnosis not present

## 2021-03-03 ENCOUNTER — Encounter (HOSPITAL_COMMUNITY): Payer: Self-pay | Admitting: *Deleted

## 2021-03-03 ENCOUNTER — Emergency Department (HOSPITAL_COMMUNITY): Payer: Medicare Other

## 2021-03-03 ENCOUNTER — Other Ambulatory Visit: Payer: Self-pay

## 2021-03-03 ENCOUNTER — Emergency Department (HOSPITAL_COMMUNITY)
Admission: EM | Admit: 2021-03-03 | Discharge: 2021-03-03 | Disposition: A | Discharge status: Home / Self Care | Payer: Medicare Other | Attending: Emergency Medicine | Admitting: Emergency Medicine

## 2021-03-03 DIAGNOSIS — R079 Chest pain, unspecified: Secondary | ICD-10-CM | POA: Diagnosis not present

## 2021-03-03 DIAGNOSIS — M542 Cervicalgia: Secondary | ICD-10-CM | POA: Diagnosis not present

## 2021-03-03 DIAGNOSIS — Z7982 Long term (current) use of aspirin: Secondary | ICD-10-CM | POA: Insufficient documentation

## 2021-03-03 DIAGNOSIS — I2510 Atherosclerotic heart disease of native coronary artery without angina pectoris: Secondary | ICD-10-CM | POA: Insufficient documentation

## 2021-03-03 DIAGNOSIS — I10 Essential (primary) hypertension: Secondary | ICD-10-CM | POA: Insufficient documentation

## 2021-03-03 DIAGNOSIS — Z87891 Personal history of nicotine dependence: Secondary | ICD-10-CM | POA: Diagnosis not present

## 2021-03-03 DIAGNOSIS — R110 Nausea: Secondary | ICD-10-CM | POA: Diagnosis not present

## 2021-03-03 DIAGNOSIS — Z79899 Other long term (current) drug therapy: Secondary | ICD-10-CM | POA: Insufficient documentation

## 2021-03-03 DIAGNOSIS — R072 Precordial pain: Secondary | ICD-10-CM | POA: Insufficient documentation

## 2021-03-03 DIAGNOSIS — R0789 Other chest pain: Secondary | ICD-10-CM | POA: Diagnosis not present

## 2021-03-03 LAB — CBC
HCT: 46 % (ref 36.0–46.0)
Hemoglobin: 15.3 g/dL — ABNORMAL HIGH (ref 12.0–15.0)
MCH: 32.9 pg (ref 26.0–34.0)
MCHC: 33.3 g/dL (ref 30.0–36.0)
MCV: 98.9 fL (ref 80.0–100.0)
Platelets: 169 10*3/uL (ref 150–400)
RBC: 4.65 MIL/uL (ref 3.87–5.11)
RDW: 12.4 % (ref 11.5–15.5)
WBC: 10.5 10*3/uL (ref 4.0–10.5)
nRBC: 0 % (ref 0.0–0.2)

## 2021-03-03 LAB — BASIC METABOLIC PANEL
Anion gap: 6 (ref 5–15)
BUN: 19 mg/dL (ref 8–23)
CO2: 29 mmol/L (ref 22–32)
Calcium: 9 mg/dL (ref 8.9–10.3)
Chloride: 103 mmol/L (ref 98–111)
Creatinine, Ser: 0.83 mg/dL (ref 0.44–1.00)
GFR, Estimated: >60 mL/min (ref >60)
Glucose, Bld: 77 mg/dL (ref 70–99)
Potassium: 3.6 mmol/L (ref 3.5–5.1)
Sodium: 138 mmol/L (ref 135–145)

## 2021-03-03 LAB — TROPONIN I (HIGH SENSITIVITY)
Troponin I (High Sensitivity): 2 ng/L (ref <18)
Troponin I (High Sensitivity): 3 ng/L (ref <18)

## 2021-03-03 NOTE — Discharge Instructions (Addendum)
You are seen in the emergency department for chest pain.  You had blood work chest x-ray EKG that did not show an obvious explanation for your pain.  There was no evidence of a heart attack.  Please follow-up with your primary care doctor and your cardiologist.  Return to the emergency department if any worsening or concerning symptoms

## 2021-03-03 NOTE — ED Triage Notes (Signed)
Chest pain onset today, had episode of pain earlier today radiating into jaw

## 2021-03-03 NOTE — ED Provider Notes (Signed)
[facility] EMERGENCY DEPARTMENT Provider Note   CSN: [id number] Arrival date & time: 03/03/21  1720     History Chief Complaint  Patient presents with   Chest Pain    Audrey Craig is a 70 y.o. female.  She was here with a complaint of about 30 minutes of left ear left sided neck pain that also involved her left axilla and chest.  It was associated with diaphoresis and nausea.  It is completely gone from her chest and axilla and she only feels a little mild discomfort in her throat.  She had a similar pain in her axilla when she had a cardiac event many years ago.  She last saw cardiology over a year ago., Dr. Domenic Polite.  She called the office today when she had this pain and it was recommended she come to the emergency department.  She said she is been under a lot of stress taking care of her ill mother.  The history is provided by the patient and a relative.  Chest Pain Pain location:  Substernal area and L lateral chest Pain quality: aching   Pain radiates to:  L jaw and neck Pain severity:  Moderate Onset quality:  Gradual Duration:  30 minutes Timing:  Constant Progression:  Resolved Chronicity:  New Relieved by:  None tried Worsened by:  Nothing Ineffective treatments:  None tried Associated symptoms: diaphoresis and nausea   Associated symptoms: no abdominal pain, no cough, no fever, no headache, no shortness of breath and no vomiting   Risk factors: coronary artery disease and high cholesterol       Past Medical History:  Diagnosis Date   Coronary atherosclerosis of native coronary artery    PCTA obtuse marginal 5/99 - previous [facility] patient   Hearing loss    Hyperlipidemia    Hypertension    NSTEMI (non-ST elevated myocardial infarction) ([place])    1999   Vision abnormalities     Patient Active Problem List   Diagnosis Date Noted   Dizziness and giddiness 10/13/2014   Numbness 10/13/2014   Abnormal brain MRI 10/13/2014   Urinary incontinence 10/13/2014    Weakness of right leg 10/13/2014   Ataxic gait 10/13/2014   Mixed hyperlipidemia 06/07/2010   Coronary atherosclerosis of native coronary artery 06/07/2010    Past Surgical History:  Procedure Laterality Date   ABDOMINAL HYSTERECTOMY     BACK SURGERY     BREAST LUMPECTOMY     CHOLECYSTECTOMY       OB History   No obstetric history on file.     Family History  Problem Relation Age of Onset   COPD Father    Heart disease Father    Diabetes type II Mother     Social History   Tobacco Use   Smoking status: Former    Packs/day: 0.50    Years: 40.00    Pack years: 20.00    Types: Cigarettes    Start date: 05/26/2012   Smokeless tobacco: Never   Tobacco comments:    stopped x 7 months, but the last week - has had a few.  Stopped again yesterday.  Substance Use Topics   Alcohol use: No    Alcohol/week: 0.0 standard drinks   Drug use: No    Home Medications Prior to Admission medications   Medication Sig Start Date End Date Taking? Authorizing Provider  acetaminophen (TYLENOL) 500 MG tablet Take 500 mg by mouth as needed.    [provider]  aspirin  81 MG tablet Take 81 mg by mouth daily.     [provider]  atorvastatin (LIPITOR) 40 MG tablet Take 1 tablet by mouth daily. 03/06/18   [provider]  Calcium Carbonate (CALTRATE 600) 1500 MG TABS Take 1 tablet by mouth daily.     [provider]  Cranberry (RA CRANBERRY) 500 MG CAPS Take 1 capsule by mouth daily.    [provider]  DULoxetine (CYMBALTA) 60 MG capsule Take 1 capsule by mouth 2 (two) times daily. 05/01/14   [provider]  ergocalciferol (VITAMIN D2) 50000 UNITS capsule Take 50,000 Units by mouth once a week.      [provider]  fish oil-omega-3 fatty acids 1000 MG capsule Take 3 g by mouth daily.     [provider]  Fluticasone-Umeclidin-Vilant (TRELEGY ELLIPTA) 100-62.5-25 MCG/INH AEPB Inhale 1 puff into the lungs every morning.     [provider]  gabapentin (NEURONTIN) 300 MG capsule Take 1 capsule by mouth 2 (two) times daily. 06/02/17   [provider]  lisinopril-hydrochlorothiazide (PRINZIDE,ZESTORETIC) 20-12.5 MG per tablet Take 1 tablet by mouth daily. 11/07/14   [provider]  loratadine (CLARITIN) 10 MG tablet Take 10 mg by mouth daily.    [provider]  metoprolol succinate (TOPROL XL) 25 MG 24 hr tablet Take 1 tablet (25 mg total) by mouth daily. 06/12/17   Satira Sark, MD  Multiple Vitamins-Minerals (SENIOR MULTIVITAMIN PLUS PO) Take 1 tablet by mouth daily.    [provider]  nitroGLYCERIN (NITROSTAT) 0.4 MG SL tablet Place 1 tablet (0.4 mg total) under the tongue every 5 (five) minutes as needed. Up to 3 doses. If no relief after 3rd dose, proceed to ED 05/28/12   Satira Sark, MD  psyllium (METAMUCIL) 0.52 G capsule Take 0.52 g by mouth daily.    [provider]  Tiotropium Bromide-Olodaterol (STIOLTO RESPIMAT) 2.5-2.5 MCG/ACT AERS Inhale into the lungs.    [provider]    Allergies    Patient has no known allergies.  Review of Systems   Review of Systems  Constitutional:  Positive for diaphoresis. Negative for fever.  HENT:  Positive for hearing loss. Negative for sore throat.   Eyes:  Negative for visual disturbance.  Respiratory:  Negative for cough and shortness of breath.   Cardiovascular:  Positive for chest pain.  Gastrointestinal:  Positive for nausea. Negative for abdominal pain and vomiting.  Genitourinary:  Negative for dysuria.  Musculoskeletal:  Positive for neck pain.  Skin:  Negative for rash.  Neurological:  Negative for headaches.   Physical Exam Updated Vital Signs BP 136/68 (BP Location: Right Arm)   Pulse 74   Temp 98.2 F (36.8 C) (Oral)   Resp 20   SpO2 94%   Physical Exam Vitals and nursing note reviewed.  Constitutional:      General: She is not in acute distress.    Appearance: She  is well-developed.  HENT:     Head: Normocephalic and atraumatic.  Eyes:     Conjunctiva/sclera: Conjunctivae normal.  Cardiovascular:     Rate and Rhythm: Normal rate and regular rhythm.     Heart sounds: Normal heart sounds. No murmur heard. Pulmonary:     Effort: Pulmonary effort is normal. No respiratory distress.     Breath sounds: Normal breath sounds.  Abdominal:     Palpations: Abdomen is soft.     Tenderness: There is no abdominal tenderness.  Musculoskeletal:  General: Normal range of motion.     Cervical back: Neck supple.     Right lower leg: No tenderness.     Left lower leg: No tenderness.  Skin:    General: Skin is warm and dry.  Neurological:     General: No focal deficit present.     Mental Status: She is alert.    ED Results / Procedures / Treatments   Labs (all labs ordered are listed, but only abnormal results are displayed) Labs Reviewed  CBC - Abnormal; Notable for the following components:      Result Value   Hemoglobin 15.3 (*)    All other components within normal limits  BASIC METABOLIC PANEL  TROPONIN I (HIGH SENSITIVITY)  TROPONIN I (HIGH SENSITIVITY)    EKG EKG Interpretation  Date/Time:  Thursday March 03 2021 17:35:49 EDT Ventricular Rate:  75 PR Interval:  155 QRS Duration: 96 QT Interval:  380 QTC Calculation: 425 R Axis:   229 Text Interpretation: Sinus rhythm Probable lateral infarct, age indeterminate T wave inversion lateral compared with prior 9/01 Confirmed by Aletta Edouard [phone]) on 03/03/2021 5:40:50 PM  Radiology DG Chest Port 1 View  Result Date: 03/03/2021 CLINICAL DATA:  Chest pain EXAM: PORTABLE CHEST 1 VIEW COMPARISON:  None. FINDINGS: Heart and mediastinal contours are within normal limits. No focal opacities or effusions. No acute bony abnormality. IMPRESSION: No active disease. Electronically Signed   By: Rolm Baptise M.D.   On: 03/03/2021 18:59    Procedures Procedures   Medications Ordered in  ED Medications - No data to display  ED Course  I have reviewed the triage vital signs and the nursing notes.  Pertinent labs & imaging results that were available during my care of the patient were reviewed by me and considered in my medical decision making (see chart for details).  Clinical Course as of 03/04/21 1044  Thu Mar 03, 2021  [place] 08/01/2017:  There was no ST segment deviation noted during stress.  The study is normal. There are no perfusion defects  This is a low risk study.  The left ventricular ejection fraction is hyperdynamic (>65%). [MB]  [id number] Chest x-ray ordered and interpreted by me as no acute infiltrates.  Awaiting radiology reading. [MB]    Clinical Course User Index [MB] Hayden Rasmussen, MD   MDM Rules/Calculators/A&P                          This patient complains of chest pain into axilla neck and jaw; this involves an extensive number of treatment Options and is a complaint that carries with it a high risk of complications and Morbidity. The differential includes ACS, musculoskeletal, PE, pneumothorax, vascular, GERD, stress  I ordered, reviewed and interpreted labs, which included CBC with normal white count normal hemoglobin, chemistries normal, troponins flat I ordered imaging studies which included chest x-ray and I independently    visualized and interpreted imaging which showed no acute findings Additional history obtained from patient's daughter Previous records obtained and reviewed in epic, no recent admissions  After the interventions stated above, I reevaluated the patient and found patient remains essentially symptom-free and hemodynamically stable.  No evidence of ischemia or infarct.  She is comfortable plan for discharge and follow-up with her cardiologist.  Return instructions discussed   Final Clinical Impression(s) / ED Diagnoses Final diagnoses:  Nonspecific chest pain    Rx / DC Orders ED Discharge Orders  None        Hayden Rasmussen, MD 03/04/21 1046

## 2021-03-10 DIAGNOSIS — I209 Angina pectoris, unspecified: Secondary | ICD-10-CM | POA: Diagnosis not present

## 2021-03-10 DIAGNOSIS — J440 Chronic obstructive pulmonary disease with acute lower respiratory infection: Secondary | ICD-10-CM | POA: Diagnosis not present

## 2021-03-10 DIAGNOSIS — K219 Gastro-esophageal reflux disease without esophagitis: Secondary | ICD-10-CM | POA: Diagnosis not present

## 2021-05-17 ENCOUNTER — Ambulatory Visit: Payer: Medicare Other | Admitting: Cardiology

## 2021-05-17 ENCOUNTER — Encounter: Payer: Self-pay | Admitting: *Deleted

## 2021-05-17 ENCOUNTER — Encounter: Payer: Self-pay | Admitting: Cardiology

## 2021-05-17 VITALS — BP 108/70 | HR 75 | Ht 69.0 in | Wt 219.0 lb

## 2021-05-17 DIAGNOSIS — E782 Mixed hyperlipidemia: Secondary | ICD-10-CM | POA: Diagnosis not present

## 2021-05-17 DIAGNOSIS — I25119 Atherosclerotic heart disease of native coronary artery with unspecified angina pectoris: Secondary | ICD-10-CM

## 2021-05-17 NOTE — Patient Instructions (Addendum)

## 2021-05-17 NOTE — Progress Notes (Signed)
Cardiology Office Note  Date: 05/17/2021   ID: Audrey Craig, DOB [birth year], MRN [id number]  PCP:  Neale Burly, MD  Cardiologist:  Rozann Lesches, MD Electrophysiologist:  None   Chief Complaint  Patient presents with   Cardiac follow-up    History of Present Illness: Audrey Craig is a 70 y.o. female last seen in March 2021.  She is here for a routine visit.  She tells me that she has not experienced any significant chest pain or use of nitroglycerin in the interim.  She continues to follow with Dr. Sherrie Sport, we are requesting interval lab work.  I reviewed her medications which are stable from a cardiac perspective and outlined below.  No obvious intolerances to Lipitor.  Last LDL in 2020 was 54.  She is still considering having a right hip surgery due to increasing pain.  She underwent a Lexiscan Myoview in 2019 that was low risk without significant ischemic territories.  No progressive symptoms since that time.  Past Medical History:  Diagnosis Date   Coronary atherosclerosis of native coronary artery    PCTA obtuse marginal 5/99 - previous [facility] patient   Hearing loss    Hyperlipidemia    Hypertension    NSTEMI (non-ST elevated myocardial infarction) ([place])    1999   Vision abnormalities     Past Surgical History:  Procedure Laterality Date   ABDOMINAL HYSTERECTOMY     BACK SURGERY     BREAST LUMPECTOMY     CHOLECYSTECTOMY      Current Outpatient Medications  Medication Sig Dispense Refill   acetaminophen (TYLENOL) 500 MG tablet Take 500 mg by mouth as needed.     albuterol (VENTOLIN HFA) 108 (90 Base) MCG/ACT inhaler Inhale 2 puffs into the lungs every 6 (six) hours as needed for wheezing or shortness of breath.     aspirin 81 MG tablet Take 81 mg by mouth daily.      atorvastatin (LIPITOR) 40 MG tablet Take 1 tablet by mouth daily.     calcium carbonate (OSCAL) 1500 (600 Ca) MG TABS tablet Take 1 tablet by mouth daily.      Cranberry 500 MG CAPS Take 1  capsule by mouth daily.     DULoxetine (CYMBALTA) 60 MG capsule Take 1 capsule by mouth 2 (two) times daily.     ergocalciferol (VITAMIN D2) 50000 UNITS capsule Take 50,000 Units by mouth once a week.       fish oil-omega-3 fatty acids 1000 MG capsule Take 3 g by mouth daily. Take 2 capsules in the morning and 2 capsules every evening     gabapentin (NEURONTIN) 300 MG capsule Take 1 capsule by mouth 3 (three) times daily.     lisinopril-hydrochlorothiazide (ZESTORETIC) 10-12.5 MG tablet Take 1 tablet by mouth daily.     loratadine (CLARITIN) 10 MG tablet Take 10 mg by mouth daily.     metoprolol succinate (TOPROL XL) 25 MG 24 hr tablet Take 1 tablet (25 mg total) by mouth daily. 30 tablet 6   Multiple Vitamins-Minerals (SENIOR MULTIVITAMIN PLUS PO) Take 1 tablet by mouth daily.     nitroGLYCERIN (NITROSTAT) 0.4 MG SL tablet Place 1 tablet (0.4 mg total) under the tongue every 5 (five) minutes as needed. Up to 3 doses. If no relief after 3rd dose, proceed to ED 25 tablet 3   psyllium (REGULOID) 0.52 g capsule Take 0.52 g by mouth daily.     No current facility-administered medications for this visit.  Allergies:  Patient has no known allergies.   ROS: No palpitations or syncope.  Physical Exam: VS:  BP 108/70 (BP Location: Left Arm, Patient Position: Sitting, Cuff Size: Large)   Pulse 75   Ht 5\' 9"  (1.753 m)   Wt 219 lb (99.3 kg)   SpO2 96%   BMI 32.34 kg/m , BMI Body mass index is 32.34 kg/m.  Wt Readings from Last 3 Encounters:  05/17/21 219 lb (99.3 kg)  10/09/19 218 lb (98.9 kg)  06/14/18 215 lb 12.8 oz (97.9 kg)    General: Patient appears comfortable at rest. HEENT: Conjunctiva and lids normal, wearing a mask. Neck: Supple, no elevated JVP or carotid bruits, no thyromegaly. Lungs: Decreased breath sounds with prolonged expiratory phase.  Nonlabored at rest.. Cardiac: Regular rate and rhythm, no S3 or significant systolic murmur, no pericardial rub. Extremities: No pitting  edema.  ECG:  An ECG dated 03/03/2021 was personally reviewed today and demonstrated:  Sinus rhythm with possible old lateral infarct pattern.  Recent Labwork: 03/03/2021: BUN 19; Creatinine, Ser 0.83; Hemoglobin 15.3; Platelets 169; Potassium 3.6; Sodium 138  June 2020: Cholesterol 124, triglycerides 126, HDL 45, LDL 54  Other Studies Reviewed Today:  Lexiscan Myoview 08/01/2017: There was no ST segment deviation noted during stress. The study is normal. There are no perfusion defects This is a low risk study. The left ventricular ejection fraction is hyperdynamic (>65%).  Assessment and Plan:  1.  CAD status post angioplasty of the obtuse marginal in 1999.  We have continued with medical therapy and observation in the absence of angina symptoms.  Follow-up Myoview in 2019 was low risk.  ECG from August showed sinus rhythm with possible old lateral infarct pattern.  She is on aspirin, Toprol-XL, Zestoretic, Lipitor, and as needed nitroglycerin which she has not had to use.  No changes made today.  2.  Mixed hyperlipidemia, tolerating Lipitor.  Requesting interval lab work from PCP.  Her last LDL to which I have access was 54.  3.  Patient considering right hip surgery due to progressive pain.  Still being evaluated by orthopedics.  Doubt that she will need further ischemic testing if surgery done within the next 6 months, presuming she does not develop any new cardiac symptoms.  Medication Adjustments/Labs and Tests Ordered: Current medicines are reviewed at length with the patient today.  Concerns regarding medicines are outlined above.   Tests Ordered: No orders of the defined types were placed in this encounter.   Medication Changes: No orders of the defined types were placed in this encounter.   Disposition:  Follow up  1 year.  Signed, Satira Sark, MD, [facility] 05/17/2021 2:42 PM    [facility] at [place], [place], [place]  [address] Phone: [phone]; Fax: [phone]

## 2021-05-18 DIAGNOSIS — I209 Angina pectoris, unspecified: Secondary | ICD-10-CM | POA: Diagnosis not present

## 2021-05-18 DIAGNOSIS — J440 Chronic obstructive pulmonary disease with acute lower respiratory infection: Secondary | ICD-10-CM | POA: Diagnosis not present

## 2021-05-18 DIAGNOSIS — E782 Mixed hyperlipidemia: Secondary | ICD-10-CM | POA: Diagnosis not present

## 2021-05-18 DIAGNOSIS — I10 Essential (primary) hypertension: Secondary | ICD-10-CM | POA: Diagnosis not present

## 2021-05-18 DIAGNOSIS — K219 Gastro-esophageal reflux disease without esophagitis: Secondary | ICD-10-CM | POA: Diagnosis not present

## 2021-05-30 DIAGNOSIS — Z1231 Encounter for screening mammogram for malignant neoplasm of breast: Secondary | ICD-10-CM | POA: Diagnosis not present

## 2021-08-18 DIAGNOSIS — K219 Gastro-esophageal reflux disease without esophagitis: Secondary | ICD-10-CM | POA: Diagnosis not present

## 2021-08-18 DIAGNOSIS — I10 Essential (primary) hypertension: Secondary | ICD-10-CM | POA: Diagnosis not present

## 2021-08-18 DIAGNOSIS — I2510 Atherosclerotic heart disease of native coronary artery without angina pectoris: Secondary | ICD-10-CM | POA: Diagnosis not present

## 2021-08-18 DIAGNOSIS — E782 Mixed hyperlipidemia: Secondary | ICD-10-CM | POA: Diagnosis not present

## 2021-08-18 DIAGNOSIS — J440 Chronic obstructive pulmonary disease with acute lower respiratory infection: Secondary | ICD-10-CM | POA: Diagnosis not present

## 2021-08-18 DIAGNOSIS — Z0000 Encounter for general adult medical examination without abnormal findings: Secondary | ICD-10-CM | POA: Diagnosis not present

## 2021-10-17 DIAGNOSIS — J301 Allergic rhinitis due to pollen: Secondary | ICD-10-CM | POA: Diagnosis not present

## 2021-11-17 DIAGNOSIS — I2510 Atherosclerotic heart disease of native coronary artery without angina pectoris: Secondary | ICD-10-CM | POA: Diagnosis not present

## 2021-11-17 DIAGNOSIS — J301 Allergic rhinitis due to pollen: Secondary | ICD-10-CM | POA: Diagnosis not present

## 2021-11-17 DIAGNOSIS — E782 Mixed hyperlipidemia: Secondary | ICD-10-CM | POA: Diagnosis not present

## 2021-11-17 DIAGNOSIS — I10 Essential (primary) hypertension: Secondary | ICD-10-CM | POA: Diagnosis not present

## 2021-11-17 DIAGNOSIS — Z0000 Encounter for general adult medical examination without abnormal findings: Secondary | ICD-10-CM | POA: Diagnosis not present

## 2021-11-17 DIAGNOSIS — J440 Chronic obstructive pulmonary disease with acute lower respiratory infection: Secondary | ICD-10-CM | POA: Diagnosis not present

## 2021-12-05 DIAGNOSIS — B029 Zoster without complications: Secondary | ICD-10-CM | POA: Diagnosis not present

## 2022-03-02 DIAGNOSIS — B029 Zoster without complications: Secondary | ICD-10-CM | POA: Diagnosis not present

## 2022-03-02 DIAGNOSIS — I10 Essential (primary) hypertension: Secondary | ICD-10-CM | POA: Diagnosis not present

## 2022-03-02 DIAGNOSIS — J440 Chronic obstructive pulmonary disease with acute lower respiratory infection: Secondary | ICD-10-CM | POA: Diagnosis not present

## 2022-03-02 DIAGNOSIS — J301 Allergic rhinitis due to pollen: Secondary | ICD-10-CM | POA: Diagnosis not present

## 2022-03-02 DIAGNOSIS — K219 Gastro-esophageal reflux disease without esophagitis: Secondary | ICD-10-CM | POA: Diagnosis not present

## 2022-04-07 IMAGING — DX DG CHEST 1V PORT
1 series · 1 of 1 positions shown · non-contrast
Comparison: None.

CLINICAL DATA: Chest pain

EXAM:
PORTABLE CHEST 1 VIEW

[chest ap]
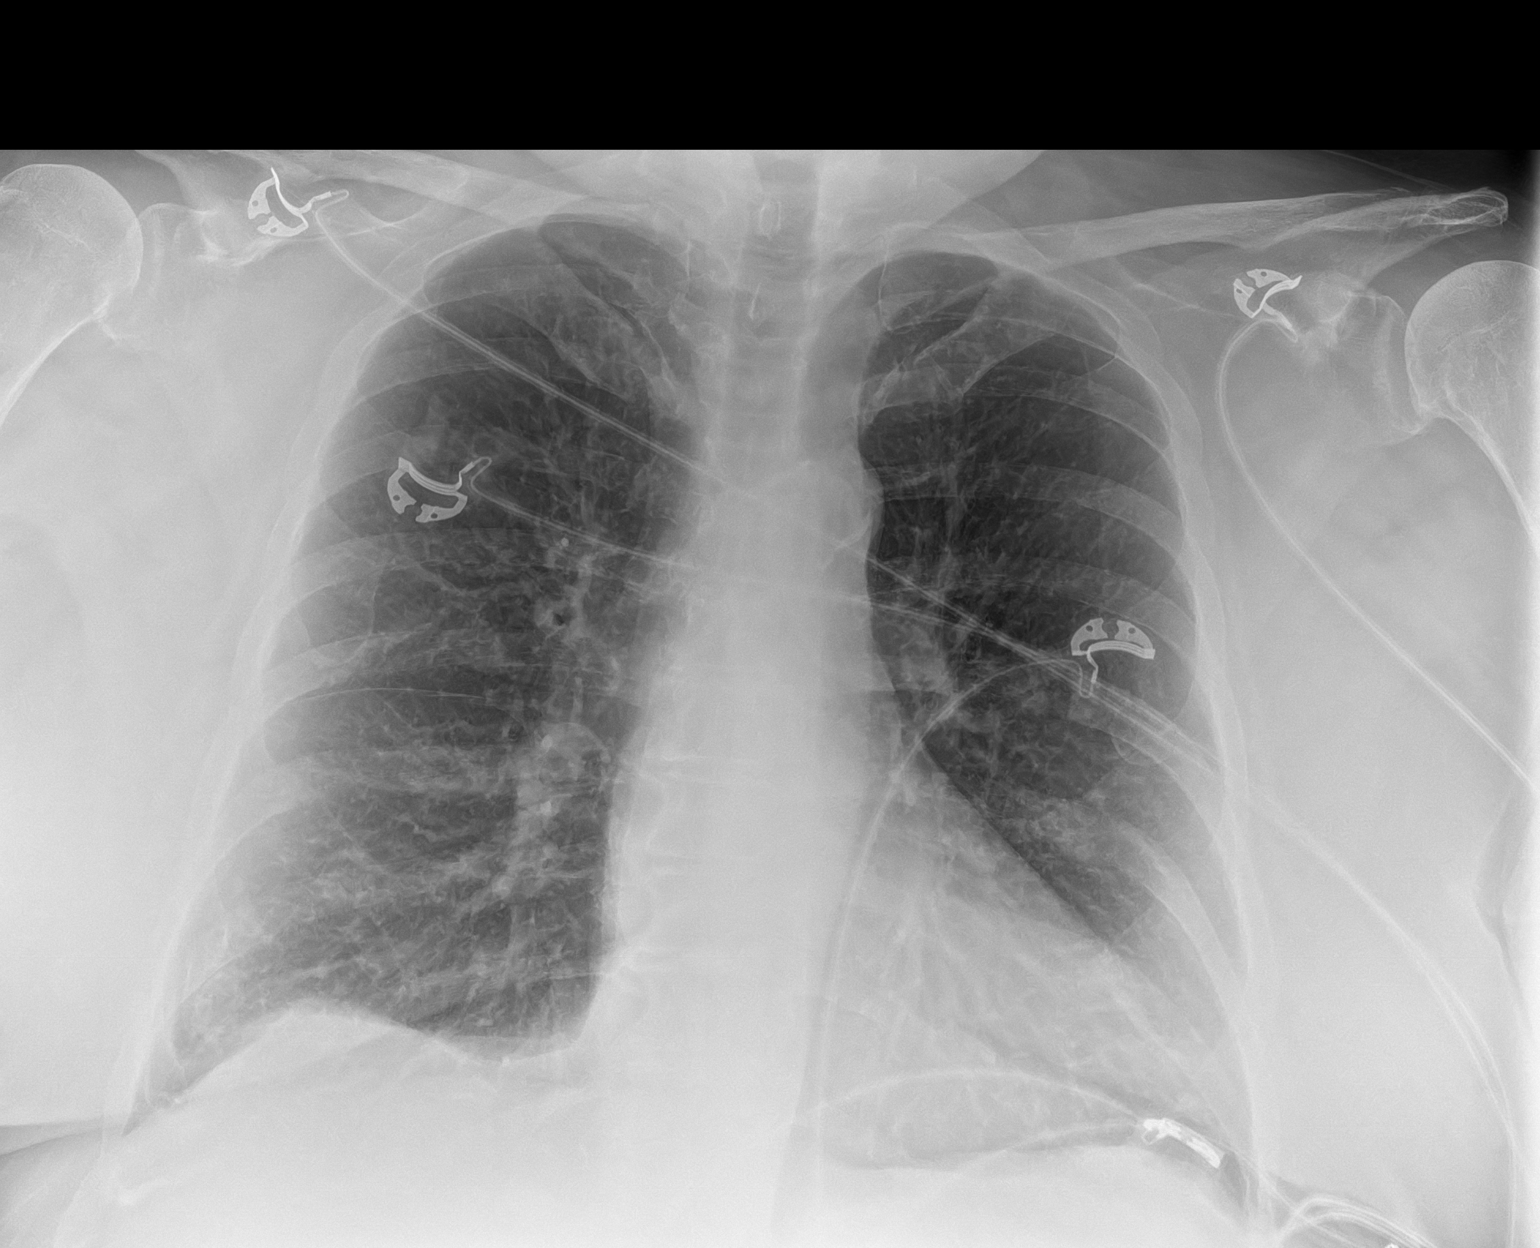

[1 of 1 positions shown; findings below may reference images not displayed]

FINDINGS: Heart and mediastinal contours are within normal limits. No focal
opacities or effusions. No acute bony abnormality.
IMPRESSION: No active disease.

## 2022-05-11 ENCOUNTER — Emergency Department (HOSPITAL_COMMUNITY): Payer: Medicare Other

## 2022-05-11 ENCOUNTER — Encounter (HOSPITAL_COMMUNITY): Payer: Self-pay | Admitting: *Deleted

## 2022-05-11 ENCOUNTER — Emergency Department (HOSPITAL_COMMUNITY)
Admission: EM | Admit: 2022-05-11 | Discharge: 2022-05-12 | Disposition: A | Discharge status: Home / Self Care | Payer: Medicare Other | Attending: Emergency Medicine | Admitting: Emergency Medicine

## 2022-05-11 ENCOUNTER — Other Ambulatory Visit: Payer: Self-pay

## 2022-05-11 DIAGNOSIS — R000 Tachycardia, unspecified: Secondary | ICD-10-CM | POA: Diagnosis not present

## 2022-05-11 DIAGNOSIS — N3000 Acute cystitis without hematuria: Secondary | ICD-10-CM | POA: Insufficient documentation

## 2022-05-11 DIAGNOSIS — J441 Chronic obstructive pulmonary disease with (acute) exacerbation: Secondary | ICD-10-CM | POA: Insufficient documentation

## 2022-05-11 DIAGNOSIS — I10 Essential (primary) hypertension: Secondary | ICD-10-CM | POA: Diagnosis not present

## 2022-05-11 DIAGNOSIS — I2510 Atherosclerotic heart disease of native coronary artery without angina pectoris: Secondary | ICD-10-CM | POA: Diagnosis not present

## 2022-05-11 DIAGNOSIS — Z79899 Other long term (current) drug therapy: Secondary | ICD-10-CM | POA: Insufficient documentation

## 2022-05-11 DIAGNOSIS — Z7982 Long term (current) use of aspirin: Secondary | ICD-10-CM | POA: Insufficient documentation

## 2022-05-11 DIAGNOSIS — R0602 Shortness of breath: Secondary | ICD-10-CM | POA: Diagnosis not present

## 2022-05-11 DIAGNOSIS — Z8616 Personal history of COVID-19: Secondary | ICD-10-CM | POA: Insufficient documentation

## 2022-05-11 HISTORY — DX: COVID-19: U07.1

## 2022-05-11 HISTORY — DX: Chronic obstructive pulmonary disease, unspecified: J44.9

## 2022-05-11 LAB — BASIC METABOLIC PANEL
Anion gap: 8 (ref 5–15)
BUN: 17 mg/dL (ref 8–23)
CO2: 27 mmol/L (ref 22–32)
Calcium: 9.1 mg/dL (ref 8.9–10.3)
Chloride: 106 mmol/L (ref 98–111)
Creatinine, Ser: 0.92 mg/dL (ref 0.44–1.00)
GFR, Estimated: >60 mL/min (ref >60)
Glucose, Bld: 129 mg/dL — ABNORMAL HIGH (ref 70–99)
Potassium: 3.2 mmol/L — ABNORMAL LOW (ref 3.5–5.1)
Sodium: 141 mmol/L (ref 135–145)

## 2022-05-11 LAB — URINALYSIS, ROUTINE W REFLEX MICROSCOPIC
Bilirubin Urine: NEGATIVE
Glucose, UA: NEGATIVE mg/dL
Ketones, ur: NEGATIVE mg/dL
Nitrite: POSITIVE — AB
Protein, ur: NEGATIVE mg/dL
Specific Gravity, Urine: 1.021 (ref 1.005–1.030)
WBC, UA: >50 WBC/hpf — ABNORMAL HIGH (ref 0–5)
pH: 5 (ref 5.0–8.0)

## 2022-05-11 LAB — CBC WITH DIFFERENTIAL/PLATELET
Abs Immature Granulocytes: 0.02 10*3/uL (ref 0.00–0.07)
Basophils Absolute: 0 10*3/uL (ref 0.0–0.1)
Basophils Relative: 1 %
Eosinophils Absolute: 0.2 10*3/uL (ref 0.0–0.5)
Eosinophils Relative: 2 %
HCT: 41 % (ref 36.0–46.0)
Hemoglobin: 13.8 g/dL (ref 12.0–15.0)
Immature Granulocytes: 0 %
Lymphocytes Relative: 37 %
Lymphs Abs: 3 10*3/uL (ref 0.7–4.0)
MCH: 31.8 pg (ref 26.0–34.0)
MCHC: 33.7 g/dL (ref 30.0–36.0)
MCV: 94.5 fL (ref 80.0–100.0)
Monocytes Absolute: 0.9 10*3/uL (ref 0.1–1.0)
Monocytes Relative: 11 %
Neutro Abs: 4 10*3/uL (ref 1.7–7.7)
Neutrophils Relative %: 49 %
Platelets: 176 10*3/uL (ref 150–400)
RBC: 4.34 MIL/uL (ref 3.87–5.11)
RDW: 12.9 % (ref 11.5–15.5)
WBC: 8.2 10*3/uL (ref 4.0–10.5)
nRBC: 0 % (ref 0.0–0.2)

## 2022-05-11 LAB — TROPONIN I (HIGH SENSITIVITY): Troponin I (High Sensitivity): 3 ng/L (ref <18)

## 2022-05-11 MED ORDER — IPRATROPIUM-ALBUTEROL 0.5-2.5 (3) MG/3ML IN SOLN
3.0000 mL | Freq: Once | RESPIRATORY_TRACT | Status: AC
  Administered 2022-05-11: 3 mL via RESPIRATORY_TRACT
  Filled 2022-05-11: qty 3

## 2022-05-11 MED ORDER — DEXAMETHASONE SODIUM PHOSPHATE 10 MG/ML IJ SOLN
10.0000 mg | Freq: Once | INTRAMUSCULAR | Status: AC
  Administered 2022-05-11: 10 mg via INTRAVENOUS
  Filled 2022-05-11: qty 1

## 2022-05-11 NOTE — ED Triage Notes (Signed)
Pt with SOB with any exertion x 2 days,  Covid recently and was sick for 10 days. Denies any CP. Hx of COPD and MI

## 2022-05-11 NOTE — ED Provider Notes (Signed)
[facility] EMERGENCY DEPARTMENT Provider Note   CSN: [id number] Arrival date & time: 05/11/22  2114     History  Chief Complaint  Patient presents with   Shortness of Breath    Audrey Craig is a 71 y.o. female.  HPI     This is a 71 year old female with a history of COPD who presents with shortness of breath.  Patient reports 2-day history of worsening shortness of breath.  Reports dyspnea on exertion.  Denies chest pain.  Recent history of COVID-19 several weeks ago.  Reports that she took a home test but did not take any medications and was able to recover at home.  She has had a nonproductive cough.  No fevers.  She continues to vape but no longer smokes cigarettes.  Patient also reports dysuria.  Home Medications Prior to Admission medications   Medication Sig Start Date End Date Taking? Authorizing Provider  cephALEXin (KEFLEX) 500 MG capsule Take 1 capsule (500 mg total) by mouth 3 (three) times daily. 05/12/22  Yes Jassiah Viviano, Barbette Hair, MD  acetaminophen (TYLENOL) 500 MG tablet Take 500 mg by mouth as needed.    [provider]  albuterol (VENTOLIN HFA) 108 (90 Base) MCG/ACT inhaler Inhale 2 puffs into the lungs every 6 (six) hours as needed for wheezing or shortness of breath.    [provider]  aspirin 81 MG tablet Take 81 mg by mouth daily.     [provider]  atorvastatin (LIPITOR) 40 MG tablet Take 1 tablet by mouth daily. 03/06/18   [provider]  calcium carbonate (OSCAL) 1500 (600 Ca) MG TABS tablet Take 1 tablet by mouth daily.     [provider]  Cranberry 500 MG CAPS Take 1 capsule by mouth daily.    [provider]  DULoxetine (CYMBALTA) 60 MG capsule Take 1 capsule by mouth 2 (two) times daily. 05/01/14   [provider]  ergocalciferol (VITAMIN D2) 50000 UNITS capsule Take 50,000 Units by mouth once a week.      [provider]  fish oil-omega-3 fatty acids 1000 MG capsule Take 3 g by  mouth daily. Take 2 capsules in the morning and 2 capsules every evening    [provider]  gabapentin (NEURONTIN) 300 MG capsule Take 1 capsule by mouth 3 (three) times daily. 06/02/17   [provider]  lisinopril-hydrochlorothiazide (ZESTORETIC) 10-12.5 MG tablet Take 1 tablet by mouth daily. 02/17/21   [provider]  loratadine (CLARITIN) 10 MG tablet Take 10 mg by mouth daily.    [provider]  metoprolol succinate (TOPROL XL) 25 MG 24 hr tablet Take 1 tablet (25 mg total) by mouth daily. 06/12/17   Satira Sark, MD  Multiple Vitamins-Minerals (SENIOR MULTIVITAMIN PLUS PO) Take 1 tablet by mouth daily.    [provider]  nitroGLYCERIN (NITROSTAT) 0.4 MG SL tablet Place 1 tablet (0.4 mg total) under the tongue every 5 (five) minutes as needed. Up to 3 doses. If no relief after 3rd dose, proceed to ED 05/28/12   Satira Sark, MD  psyllium (REGULOID) 0.52 g capsule Take 0.52 g by mouth daily.    [provider]      Allergies    Patient has no known allergies.    Review of Systems   Review of Systems  Constitutional:  Negative for fever.  Respiratory:  Positive for cough and shortness of breath.   Cardiovascular:  Negative for chest pain.  All  other systems reviewed and are negative.   Physical Exam Updated Vital Signs BP (!) 113/49   Pulse (!) 120   Temp 97.9 F (36.6 C) (Oral)   Resp 17   SpO2 93%  Physical Exam Vitals and nursing note reviewed.  Constitutional:      Appearance: She is well-developed. She is obese.     Comments: Chronically ill-appearing but no acute distress  HENT:     Head: Normocephalic and atraumatic.  Eyes:     Pupils: Pupils are equal, round, and reactive to light.  Cardiovascular:     Rate and Rhythm: Normal rate and regular rhythm.     Heart sounds: Normal heart sounds.  Pulmonary:     Effort: Pulmonary effort is normal. No respiratory distress.     Breath sounds: No wheezing.      Comments: Nasal cannula in place, rhonchorous breath sounds with expiratory wheezing noted Abdominal:     General: Bowel sounds are normal.     Palpations: Abdomen is soft.  Musculoskeletal:     Cervical back: Neck supple.     Right lower leg: No tenderness. No edema.     Left lower leg: No tenderness. No edema.  Skin:    General: Skin is warm and dry.  Neurological:     Mental Status: She is alert and oriented to person, place, and time.  Psychiatric:        Mood and Affect: Mood normal.     ED Results / Procedures / Treatments   Labs (all labs ordered are listed, but only abnormal results are displayed) Labs Reviewed  BASIC METABOLIC PANEL - Abnormal; Notable for the following components:      Result Value   Potassium 3.2 (*)    Glucose, Bld 129 (*)    All other components within normal limits  URINALYSIS, ROUTINE W REFLEX MICROSCOPIC - Abnormal; Notable for the following components:   APPearance HAZY (*)    Hgb urine dipstick MODERATE (*)    Nitrite POSITIVE (*)    Leukocytes,Ua LARGE (*)    WBC, UA >50 (*)    Bacteria, UA MANY (*)    All other components within normal limits  URINE CULTURE  CBC WITH DIFFERENTIAL/PLATELET  TROPONIN I (HIGH SENSITIVITY)  TROPONIN I (HIGH SENSITIVITY)    EKG EKG Interpretation  Date/Time:  Thursday May 11 2022 21:51:01 EDT Ventricular Rate:  95 PR Interval:  152 QRS Duration: 109 QT Interval:  370 QTC Calculation: 466 R Axis:   27 Text Interpretation: Sinus rhythm Low voltage, precordial leads Confirmed by Thayer Jew [phone]) on 05/11/2022 10:53:10 PM  Radiology DG Chest Portable 1 View  Result Date: 05/11/2022 CLINICAL DATA:  SOB EXAM: PORTABLE CHEST 1 VIEW COMPARISON:  Chest x-ray 03/03/2021 FINDINGS: The heart and mediastinal contours are unchanged. Aortic calcification. No focal consolidation. No pulmonary edema. No pleural effusion. No pneumothorax. No acute osseous abnormality. IMPRESSION: No active  disease. Electronically Signed   By: Iven Finn M.D.   On: 05/11/2022 22:18    Procedures .Critical Care  Performed by: Merryl Hacker, MD Authorized by: Merryl Hacker, MD   Critical care provider statement:    Critical care time (minutes):  40   Critical care was necessary to treat or prevent imminent or life-threatening deterioration of the following conditions:  Respiratory failure (COPD exacerbations with multiple rechecks)   Critical care was time spent personally by me on the following activities:  Development of treatment plan with patient or surrogate,  discussions with consultants, evaluation of patient's response to treatment, examination of patient, ordering and review of laboratory studies, ordering and review of radiographic studies, ordering and performing treatments and interventions, pulse oximetry, re-evaluation of patient's condition and review of old charts     Medications Ordered in ED Medications  dexamethasone (DECADRON) injection 10 mg (10 mg Intravenous Given 05/11/22 2306)  ipratropium-albuterol (DUONEB) 0.5-2.5 (3) MG/3ML nebulizer solution 3 mL (3 mLs Nebulization Given 05/11/22 2334)  cefTRIAXone (ROCEPHIN) 1 g in sodium chloride 0.9 % 100 mL IVPB (0 g Intravenous Stopped 05/12/22 0049)  ipratropium-albuterol (DUONEB) 0.5-2.5 (3) MG/3ML nebulizer solution 3 mL (3 mLs Nebulization Given 05/12/22 0147)    ED Course/ Medical Decision Making/ A&P Clinical Course as of 05/12/22 0300  Fri May 12, 2022  0146 Reports improvement.  She does have some continued wheeze.  We will repeat DuoNeb and wean oxygen. [CH]  0258 Patient sitting up at the bedside.  Breath sounds more clear.  States she feels much better.  She remains mildly tachycardic with a heart rate 105-110.  She did just receive a DuoNeb.  She reports she has albuterol at home.  O2 sats 89 to 92% at rest.  Patient would like to be discharged home.  Feel this is reasonable.  Discussed with her that  she will need to use her albuterol every 2-4 hours for the next 12 hours.  We will also discharge home with antibiotic for UTI. [CH]    Clinical Course User Index [CH] Angle Dirusso, Barbette Hair, MD                           Medical Decision Making Amount and/or Complexity of Data Reviewed Labs: ordered. Radiology: ordered.  Risk Prescription drug management.   This patient presents to the ED for concern of shortness of breath, this involves an extensive number of treatment options, and is a complaint that carries with it a high risk of complications and morbidity.  I considered the following differential and admission for this acute, potentially life threatening condition.  The differential diagnosis includes pneumonia, viral illness, COPD exacerbation, sepsis, UTI  MDM:    This is a 71 year old female who presents with concern for shortness of breath.  She is wheezing on exam.  She is on 2 L of oxygen for room air sats of 88%.  Clinically she is consistent with a COPD exacerbation although pneumonia or viral illness cannot be excluded.  Reports recent history of COVID-19.  Patient was given Decadron and a DuoNeb.  Chest x-ray does not show any evidence of pneumothorax or pneumonia.  EKG shows no evidence of acute arrhythmia or ischemia.  Patient had progressive improvement of symptoms with repeat DuoNebs.  Additionally, urinalysis was sent and is nitrite positive with many bacteria and white cells.  Culture was sent.  Patient was given a dose of IV Rocephin.  Clinically, patient had progressive improvement.  She was ultimately weaned off of oxygen.  While she had marginal O2 sats of 89 to 93%, she reported significant improvement and had increased breath sounds.  She was to be discharged home.  We discussed ongoing supportive measures at home including frequent albuterol for the next 12 hours.  Patient stated understanding.  She will also be discharged with antibiotic for her UTI.  (Labs, imaging,  consults)  Labs: I Ordered, and personally interpreted labs.  The pertinent results include: CBC, BMP, troponin x2, urinalysis  Imaging Studies ordered: I ordered  imaging studies including chest x-ray I independently visualized and interpreted imaging. I agree with the radiologist interpretation  Additional history obtained from daughters at bedside.  External records from outside source obtained and reviewed including prior evaluations  Cardiac Monitoring: The patient was maintained on a cardiac monitor.  I personally viewed and interpreted the cardiac monitored which showed an underlying rhythm of: Sinus rhythm  Reevaluation: After the interventions noted above, I reevaluated the patient and found that they have :improved  Social Determinants of Health: Lives independently  Disposition: Discharge  Co morbidities that complicate the patient evaluation  Past Medical History:  Diagnosis Date   COPD (chronic obstructive pulmonary disease) ([place])    Coronary atherosclerosis of native coronary artery    PCTA obtuse marginal 5/99 - previous [facility] patient   COVID    Hearing loss    Hyperlipidemia    Hypertension    NSTEMI (non-ST elevated myocardial infarction) ([place])    1999   Vision abnormalities      Medicines Meds ordered this encounter  Medications   dexamethasone (DECADRON) injection 10 mg   ipratropium-albuterol (DUONEB) 0.5-2.5 (3) MG/3ML nebulizer solution 3 mL   cefTRIAXone (ROCEPHIN) 1 g in sodium chloride 0.9 % 100 mL IVPB    Order Specific Question:   Antibiotic Indication:    Answer:   UTI   ipratropium-albuterol (DUONEB) 0.5-2.5 (3) MG/3ML nebulizer solution 3 mL   cephALEXin (KEFLEX) 500 MG capsule    Sig: Take 1 capsule (500 mg total) by mouth 3 (three) times daily.    Dispense:  21 capsule    Refill:  0    I have reviewed the patients home medicines and have made adjustments as needed  Problem List / ED Course: Problem List Items Addressed This Visit    None Visit Diagnoses     COPD exacerbation ([place])    -  Primary   Relevant Medications   dexamethasone (DECADRON) injection 10 mg (Completed)   ipratropium-albuterol (DUONEB) 0.5-2.5 (3) MG/3ML nebulizer solution 3 mL (Completed)   ipratropium-albuterol (DUONEB) 0.5-2.5 (3) MG/3ML nebulizer solution 3 mL (Completed)   Acute cystitis without hematuria                       Final Clinical Impression(s) / ED Diagnoses Final diagnoses:  COPD exacerbation ([place])  Acute cystitis without hematuria    Rx / DC Orders ED Discharge Orders          Ordered    cephALEXin (KEFLEX) 500 MG capsule  3 times daily        05/12/22 0259              Merryl Hacker, MD 05/12/22 [phone]

## 2022-05-12 LAB — TROPONIN I (HIGH SENSITIVITY): Troponin I (High Sensitivity): 3 ng/L (ref <18)

## 2022-05-12 MED ORDER — SODIUM CHLORIDE 0.9 % IV SOLN
1.0000 g | Freq: Once | INTRAVENOUS | Status: AC
  Administered 2022-05-12: 1 g via INTRAVENOUS
  Filled 2022-05-12: qty 10

## 2022-05-12 MED ORDER — IPRATROPIUM-ALBUTEROL 0.5-2.5 (3) MG/3ML IN SOLN
3.0000 mL | Freq: Once | RESPIRATORY_TRACT | Status: AC
  Administered 2022-05-12: 3 mL via RESPIRATORY_TRACT
  Filled 2022-05-12: qty 3

## 2022-05-12 MED ORDER — CEPHALEXIN 500 MG PO CAPS: 500.0000 mg | ORAL_CAPSULE | Freq: Three times a day (TID) | ORAL | 0 refills | Status: DC

## 2022-05-12 NOTE — Discharge Instructions (Signed)
You were seen today for shortness of breath.  This likely is a COPD exacerbation.  Avoid vaping.  Use your albuterol every 2-4 hours over the next 12 hours.  Additionally, you were found to have a UTI.  Take antibiotics as prescribed.  If you have any new or worsening symptoms, you should be reevaluated.

## 2022-05-14 LAB — URINE CULTURE: Culture: >=100000 — AB

## 2022-05-15 ENCOUNTER — Telehealth (HOSPITAL_BASED_OUTPATIENT_CLINIC_OR_DEPARTMENT_OTHER): Payer: Self-pay | Admitting: *Deleted

## 2022-05-15 NOTE — Telephone Encounter (Signed)
Post ED Visit - Positive Culture Follow-up  Culture report reviewed by antimicrobial stewardship pharmacist: [facility] Team '[]'$  Elenor Quinones, Pharm.D. '[]'$  Heide Guile, Pharm.D., BCPS AQ-ID '[]'$  Parks Neptune, Pharm.D., BCPS '[]'$  Alycia Rossetti, Pharm.D., BCPS '[]'$  [place], Pharm.D., BCPS, AAHIVP '[]'$  Legrand Como, Pharm.D., BCPS, AAHIVP '[]'$  Salome Arnt, PharmD, BCPS '[]'$  Johnnette Gourd, PharmD, BCPS '[]'$  Hughes Better, PharmD, BCPS '[]'$  Leeroy Cha, PharmD '[]'$  Laqueta Linden, PharmD, BCPS '[x]'$  Esmeralda Arthur,  PharmD  [place] Team '[]'$  Leodis Sias, PharmD '[]'$  Lindell Spar, PharmD '[]'$  Royetta Asal, PharmD '[]'$  Graylin Shiver, Rph '[]'$  Rema Fendt) Glennon Mac, PharmD '[]'$  Arlyn Dunning, PharmD '[]'$  Netta Cedars, PharmD '[]'$  Dia Sitter, PharmD '[]'$  Leone Haven, PharmD '[]'$  Gretta Arab, PharmD '[]'$  Theodis Shove, PharmD '[]'$  Peggyann Juba, PharmD '[]'$  Reuel Boom, PharmD   Positive urine culture Treated with Cephalexin, organism sensitive to the same and no further patient follow-up is required at this time.  Audrey Craig 05/15/2022, 9:44 AM

## 2022-05-19 ENCOUNTER — Ambulatory Visit: Payer: Medicare Other | Attending: Cardiology | Admitting: Cardiology

## 2022-05-19 ENCOUNTER — Encounter: Payer: Self-pay | Admitting: Cardiology

## 2022-05-19 VITALS — BP 122/70 | HR 69 | Ht 69.0 in | Wt 206.4 lb

## 2022-05-19 DIAGNOSIS — E782 Mixed hyperlipidemia: Secondary | ICD-10-CM

## 2022-05-19 DIAGNOSIS — I25119 Atherosclerotic heart disease of native coronary artery with unspecified angina pectoris: Secondary | ICD-10-CM | POA: Diagnosis not present

## 2022-05-19 NOTE — Addendum Note (Signed)
Addended by: Laurine Blazer on: 05/19/2022 04:11 PM   Modules accepted: Orders

## 2022-05-19 NOTE — Progress Notes (Signed)
Cardiology Office Note  Date: 05/19/2022   ID: Audrey Craig, DOB 30-Jul-1950, MRN [id number]  PCP:  Neale Burly, MD  Cardiologist:  Rozann Lesches, MD Electrophysiologist:  None   Chief Complaint  Patient presents with   Cardiac follow-up    History of Present Illness: Audrey Craig is a 71 y.o. female last seen in October 2022.  She is here for a routine visit. She was just recently seen in the ER with shortness of breath, had COVID-19 one month ago untreated and managed at home.  She was felt to have COPD exacerbation, no infiltrates by chest x-ray.  ECG showed no significant ST segment changes.  She was treated with antibiotics, also found to have concurrent UTI.  She states that she has had a raspy cough, upper airway congestion.  No fevers or chills.  She sees her PCP on Monday.  Completing course of Keflex and also using MDIs.  No angina.  I personally reviewed her ECG which shows sinus rhythm with low voltage.  I went over her cardiac medications which are stable and outlined below.  Past Medical History:  Diagnosis Date   COPD (chronic obstructive pulmonary disease) ([place])    Coronary atherosclerosis of native coronary artery    PCTA obtuse marginal 5/99 - previous [facility] patient   COVID    Hearing loss    Hyperlipidemia    Hypertension    NSTEMI (non-ST elevated myocardial infarction) ([place])    1999   Vision abnormalities     Past Surgical History:  Procedure Laterality Date   ABDOMINAL HYSTERECTOMY     BACK SURGERY     BREAST LUMPECTOMY     CHOLECYSTECTOMY      Current Outpatient Medications  Medication Sig Dispense Refill   acetaminophen (TYLENOL) 500 MG tablet Take 500 mg by mouth as needed.     albuterol (VENTOLIN HFA) 108 (90 Base) MCG/ACT inhaler Inhale 2 puffs into the lungs every 6 (six) hours as needed for wheezing or shortness of breath.     aspirin 81 MG tablet Take 81 mg by mouth daily.      atorvastatin (LIPITOR) 40 MG tablet Take 1 tablet  by mouth daily.     calcium carbonate (OSCAL) 1500 (600 Ca) MG TABS tablet Take 1 tablet by mouth daily.      cephALEXin (KEFLEX) 500 MG capsule Take 1 capsule (500 mg total) by mouth 3 (three) times daily. 21 capsule 0   Cranberry 500 MG CAPS Take 1 capsule by mouth daily.     DULoxetine (CYMBALTA) 60 MG capsule Take 1 capsule by mouth 2 (two) times daily.     ergocalciferol (VITAMIN D2) 50000 UNITS capsule Take 50,000 Units by mouth once a week.       fish oil-omega-3 fatty acids 1000 MG capsule Take 3 g by mouth daily. Take 2 capsules in the morning and 2 capsules every evening     gabapentin (NEURONTIN) 300 MG capsule Take 1 capsule by mouth 3 (three) times daily.     lisinopril-hydrochlorothiazide (ZESTORETIC) 10-12.5 MG tablet Take 1 tablet by mouth daily.     loratadine (CLARITIN) 10 MG tablet Take 10 mg by mouth daily.     metoprolol succinate (TOPROL XL) 25 MG 24 hr tablet Take 1 tablet (25 mg total) by mouth daily. 30 tablet 6   Multiple Vitamins-Minerals (SENIOR MULTIVITAMIN PLUS PO) Take 1 tablet by mouth daily.     nitroGLYCERIN (NITROSTAT) 0.4 MG SL tablet Place  1 tablet (0.4 mg total) under the tongue every 5 (five) minutes as needed. Up to 3 doses. If no relief after 3rd dose, proceed to ED 25 tablet 3   psyllium (REGULOID) 0.52 g capsule Take 0.52 g by mouth daily.     No current facility-administered medications for this visit.   Allergies:  Patient has no known allergies.   ROS: No palpitations or syncope.  Physical Exam: VS:  BP 122/70   Pulse 69   Ht '5\' 9"'$  (1.753 m)   Wt 206 lb 6.4 oz (93.6 kg)   SpO2 94%   BMI 30.48 kg/m , BMI Body mass index is 30.48 kg/m.  Wt Readings from Last 3 Encounters:  05/19/22 206 lb 6.4 oz (93.6 kg)  05/17/21 219 lb (99.3 kg)  10/09/19 218 lb (98.9 kg)    General: Patient appears comfortable at rest. HEENT: Conjunctiva and lids normal. Neck: Supple, no elevated JVP or carotid bruits. Lungs: Decreased breath sounds without  wheezing, upper airway congestion noted, nonlabored breathing at rest. Cardiac: Regular rate and rhythm, no S3 or significant systolic murmur. Extremities: No pitting edema.  ECG:  An ECG dated 05/11/2022 was personally reviewed today and demonstrated:  Sinus rhythm.  Recent Labwork: 05/11/2022: BUN 17; Creatinine, Ser 0.92; Hemoglobin 13.8; Platelets 176; Potassium 3.2; Sodium 141   Other Studies Reviewed Today:  Lexiscan Myoview 08/01/2017: There was no ST segment deviation noted during stress. The study is normal. There are no perfusion defects This is a low risk study. The left ventricular ejection fraction is hyperdynamic (>65%).  Chest x-ray 05/11/2022: FINDINGS: The heart and mediastinal contours are unchanged. Aortic calcification.   No focal consolidation. No pulmonary edema. No pleural effusion. No pneumothorax.   No acute osseous abnormality.   IMPRESSION: No active disease.  Assessment and Plan:  1.  CAD status post angioplasty of the obtuse marginal in 1999.  We continue medical therapy and observation in the absence of angina.  Last ischemic testing was in 2019.  High-sensitivity troponin I level normal during recent ER evaluation for COPD exacerbation.  ECG reviewed and stable.  Continue aspirin, Lipitor, Zestoretic, Toprol-XL, and as needed nitroglycerin.  2.  Mixed hyperlipidemia on Lipitor.  3.  COPD with recent exacerbation, also reportedly COVID-19 about 1 month ago and other environmental contaminants with recent HVAC system trouble at home.  She sees her PCP on Monday.  May need a course of steroids along with MDIs.  Medication Adjustments/Labs and Tests Ordered: Current medicines are reviewed at length with the patient today.  Concerns regarding medicines are outlined above.   Tests Ordered: No orders of the defined types were placed in this encounter.   Medication Changes: No orders of the defined types were placed in this  encounter.   Disposition:  Follow up  6 months.  Signed, Satira Sark, MD, [facility] 05/19/2022 12:07 PM    [facility] [place], [place], [place] [address] Phone: [phone]; Fax: [phone]

## 2022-05-19 NOTE — Patient Instructions (Signed)

## 2022-05-22 DIAGNOSIS — J441 Chronic obstructive pulmonary disease with (acute) exacerbation: Secondary | ICD-10-CM | POA: Diagnosis not present

## 2022-06-06 DIAGNOSIS — J301 Allergic rhinitis due to pollen: Secondary | ICD-10-CM | POA: Diagnosis not present

## 2022-06-06 DIAGNOSIS — Z1231 Encounter for screening mammogram for malignant neoplasm of breast: Secondary | ICD-10-CM | POA: Diagnosis not present

## 2022-06-06 DIAGNOSIS — K219 Gastro-esophageal reflux disease without esophagitis: Secondary | ICD-10-CM | POA: Diagnosis not present

## 2022-06-06 DIAGNOSIS — J441 Chronic obstructive pulmonary disease with (acute) exacerbation: Secondary | ICD-10-CM | POA: Diagnosis not present

## 2022-06-06 DIAGNOSIS — I2510 Atherosclerotic heart disease of native coronary artery without angina pectoris: Secondary | ICD-10-CM | POA: Diagnosis not present

## 2022-06-06 DIAGNOSIS — I10 Essential (primary) hypertension: Secondary | ICD-10-CM | POA: Diagnosis not present

## 2022-06-06 DIAGNOSIS — Z8673 Personal history of transient ischemic attack (TIA), and cerebral infarction without residual deficits: Secondary | ICD-10-CM | POA: Diagnosis not present

## 2022-08-21 DIAGNOSIS — Z0000 Encounter for general adult medical examination without abnormal findings: Secondary | ICD-10-CM | POA: Diagnosis not present

## 2022-08-21 DIAGNOSIS — Z8673 Personal history of transient ischemic attack (TIA), and cerebral infarction without residual deficits: Secondary | ICD-10-CM | POA: Diagnosis not present

## 2022-08-21 DIAGNOSIS — I10 Essential (primary) hypertension: Secondary | ICD-10-CM | POA: Diagnosis not present

## 2022-08-21 DIAGNOSIS — J301 Allergic rhinitis due to pollen: Secondary | ICD-10-CM | POA: Diagnosis not present

## 2022-08-21 DIAGNOSIS — R7303 Prediabetes: Secondary | ICD-10-CM | POA: Diagnosis not present

## 2022-08-21 DIAGNOSIS — I2510 Atherosclerotic heart disease of native coronary artery without angina pectoris: Secondary | ICD-10-CM | POA: Diagnosis not present

## 2022-08-21 DIAGNOSIS — K219 Gastro-esophageal reflux disease without esophagitis: Secondary | ICD-10-CM | POA: Diagnosis not present

## 2022-08-21 DIAGNOSIS — J441 Chronic obstructive pulmonary disease with (acute) exacerbation: Secondary | ICD-10-CM | POA: Diagnosis not present

## 2022-08-21 DIAGNOSIS — J440 Chronic obstructive pulmonary disease with acute lower respiratory infection: Secondary | ICD-10-CM | POA: Diagnosis not present

## 2022-08-25 DIAGNOSIS — G459 Transient cerebral ischemic attack, unspecified: Secondary | ICD-10-CM | POA: Diagnosis not present

## 2022-09-05 DIAGNOSIS — J449 Chronic obstructive pulmonary disease, unspecified: Secondary | ICD-10-CM | POA: Diagnosis not present

## 2022-09-05 DIAGNOSIS — J301 Allergic rhinitis due to pollen: Secondary | ICD-10-CM | POA: Diagnosis not present

## 2022-09-05 DIAGNOSIS — Z8673 Personal history of transient ischemic attack (TIA), and cerebral infarction without residual deficits: Secondary | ICD-10-CM | POA: Diagnosis not present

## 2022-09-05 DIAGNOSIS — I2510 Atherosclerotic heart disease of native coronary artery without angina pectoris: Secondary | ICD-10-CM | POA: Diagnosis not present

## 2022-09-05 DIAGNOSIS — I10 Essential (primary) hypertension: Secondary | ICD-10-CM | POA: Diagnosis not present

## 2022-09-05 DIAGNOSIS — K219 Gastro-esophageal reflux disease without esophagitis: Secondary | ICD-10-CM | POA: Diagnosis not present

## 2022-12-04 DIAGNOSIS — J449 Chronic obstructive pulmonary disease, unspecified: Secondary | ICD-10-CM | POA: Diagnosis not present

## 2022-12-04 DIAGNOSIS — Z8673 Personal history of transient ischemic attack (TIA), and cerebral infarction without residual deficits: Secondary | ICD-10-CM | POA: Diagnosis not present

## 2022-12-04 DIAGNOSIS — K219 Gastro-esophageal reflux disease without esophagitis: Secondary | ICD-10-CM | POA: Diagnosis not present

## 2022-12-04 DIAGNOSIS — I2510 Atherosclerotic heart disease of native coronary artery without angina pectoris: Secondary | ICD-10-CM | POA: Diagnosis not present

## 2022-12-04 DIAGNOSIS — Z0000 Encounter for general adult medical examination without abnormal findings: Secondary | ICD-10-CM | POA: Diagnosis not present

## 2022-12-04 DIAGNOSIS — I10 Essential (primary) hypertension: Secondary | ICD-10-CM | POA: Diagnosis not present

## 2022-12-04 DIAGNOSIS — J301 Allergic rhinitis due to pollen: Secondary | ICD-10-CM | POA: Diagnosis not present

## 2022-12-08 NOTE — Progress Notes (Unsigned)
    Cardiology Office Note  Date: 12/11/2022   ID: Shamell, Ferrare Apr 24, 1951, MRN [id number]  History of Present Illness: Audrey Craig is a 72 y.o. female last seen in October 2023.  She is here for a follow-up visit.  Reports no angina or nitroglycerin use.  She has limitations related to knee and hip pain, using a cane to ambulate.  She takes Tylenol for discomfort at home.  Pain control joint injections were not effective by report.  I reviewed her medications, she reports compliance with Lipitor and had LDL 65 in January of this year.  Blood pressure today is well-controlled.  She also had carotid Dopplers done through [facility] back in February, no hemodynamically significant stenoses noted.  Physical Exam: VS:  BP 118/68   Pulse 70   Ht 5\' 8"  (1.727 m)   Wt 211 lb (95.7 kg)   SpO2 95%   BMI 32.08 kg/m , BMI Body mass index is 32.08 kg/m.  Wt Readings from Last 3 Encounters:  12/11/22 211 lb (95.7 kg)  05/19/22 206 lb 6.4 oz (93.6 kg)  05/17/21 219 lb (99.3 kg)    General: Patient appears comfortable at rest. HEENT: Conjunctiva and lids normal. Neck: Supple, no elevated JVP or carotid bruits. Lungs: Decreased breath sounds without wheezing, nonlabored breathing at rest. Cardiac: Regular rate and rhythm, no S3 or significant systolic murmur. Extremities: No pitting edema.  ECG:  An ECG dated 05/19/2022 was personally reviewed today and demonstrated:  Sinus rhythm with low voltage.  Labwork: 05/11/2022: BUN 17; Creatinine, Ser 0.92; Hemoglobin 13.8; Platelets 176; Potassium 3.2; Sodium 141  January 2024: BUN 13, creatinine 0.76, potassium 4, AST 26, ALT 24, cholesterol 136, triglycerides 163, HDL 43, LDL 65, hemoglobin A1c 5.7%  Other Studies Reviewed Today:  Carotid Dopplers 08/25/2022 [facility]): RIGHT CAROTID ARTERY: No significant calcified disease of the right  common carotid artery. Intermediate waveform maintained. Homogeneous  plaque without  significant calcifications at the right carotid  bifurcation. Low resistance waveform of the right ICA. Tortuosity   RIGHT VERTEBRAL ARTERY: Antegrade flow with low resistance waveform.   LEFT CAROTID ARTERY: No significant calcified disease of the left  common carotid artery. Intermediate waveform maintained. Homogeneous  plaque at the left carotid bifurcation without significant  calcifications. Low resistance waveform of the left ICA. Tortuosity   LEFT VERTEBRAL ARTERY:  Antegrade flow with low resistance waveform.   Assessment and Plan:  1.  CAD status post angioplasty of the obtuse marginal in 1999.  Follow-up Lexiscan Myoview in 2019 was negative for ischemia with normal LVEF.  She reports no angina on medical therapy and would continue with observation.  Currently on Plavix, Lipitor, Toprol-XL, and as needed nitroglycerin.  2.  Mixed hyperlipidemia on Lipitor.  LDL 65 in January.  No changes were made today.  3.  Essential hypertension.  She is also on Zestoretic, blood pressure is well-controlled today.  Disposition:  Follow up  6 months.  Signed, Jonelle Sidle, M.D., F.A.C.C. [facility]

## 2022-12-11 ENCOUNTER — Ambulatory Visit: Payer: Medicare Other | Attending: Cardiology | Admitting: Cardiology

## 2022-12-11 ENCOUNTER — Encounter: Payer: Self-pay | Admitting: Cardiology

## 2022-12-11 VITALS — BP 118/68 | HR 70 | Ht 68.0 in | Wt 211.0 lb

## 2022-12-11 DIAGNOSIS — I10 Essential (primary) hypertension: Secondary | ICD-10-CM

## 2022-12-11 DIAGNOSIS — I25119 Atherosclerotic heart disease of native coronary artery with unspecified angina pectoris: Secondary | ICD-10-CM | POA: Diagnosis not present

## 2022-12-11 DIAGNOSIS — E782 Mixed hyperlipidemia: Secondary | ICD-10-CM

## 2022-12-11 NOTE — Patient Instructions (Addendum)

## 2023-01-17 DIAGNOSIS — M810 Age-related osteoporosis without current pathological fracture: Secondary | ICD-10-CM | POA: Diagnosis not present

## 2023-03-05 DIAGNOSIS — I2510 Atherosclerotic heart disease of native coronary artery without angina pectoris: Secondary | ICD-10-CM | POA: Diagnosis not present

## 2023-03-05 DIAGNOSIS — K219 Gastro-esophageal reflux disease without esophagitis: Secondary | ICD-10-CM | POA: Diagnosis not present

## 2023-03-05 DIAGNOSIS — Z0000 Encounter for general adult medical examination without abnormal findings: Secondary | ICD-10-CM | POA: Diagnosis not present

## 2023-03-05 DIAGNOSIS — I10 Essential (primary) hypertension: Secondary | ICD-10-CM | POA: Diagnosis not present

## 2023-03-05 DIAGNOSIS — J301 Allergic rhinitis due to pollen: Secondary | ICD-10-CM | POA: Diagnosis not present

## 2023-03-05 DIAGNOSIS — Z8673 Personal history of transient ischemic attack (TIA), and cerebral infarction without residual deficits: Secondary | ICD-10-CM | POA: Diagnosis not present

## 2023-03-05 DIAGNOSIS — J449 Chronic obstructive pulmonary disease, unspecified: Secondary | ICD-10-CM | POA: Diagnosis not present

## 2023-06-11 DIAGNOSIS — J449 Chronic obstructive pulmonary disease, unspecified: Secondary | ICD-10-CM | POA: Diagnosis not present

## 2023-06-11 DIAGNOSIS — I2510 Atherosclerotic heart disease of native coronary artery without angina pectoris: Secondary | ICD-10-CM | POA: Diagnosis not present

## 2023-06-11 DIAGNOSIS — L570 Actinic keratosis: Secondary | ICD-10-CM | POA: Diagnosis not present

## 2023-06-11 DIAGNOSIS — I10 Essential (primary) hypertension: Secondary | ICD-10-CM | POA: Diagnosis not present

## 2023-06-11 DIAGNOSIS — N182 Chronic kidney disease, stage 2 (mild): Secondary | ICD-10-CM | POA: Diagnosis not present

## 2023-06-11 DIAGNOSIS — J301 Allergic rhinitis due to pollen: Secondary | ICD-10-CM | POA: Diagnosis not present

## 2023-06-11 DIAGNOSIS — K219 Gastro-esophageal reflux disease without esophagitis: Secondary | ICD-10-CM | POA: Diagnosis not present

## 2023-06-11 DIAGNOSIS — M1710 Unilateral primary osteoarthritis, unspecified knee: Secondary | ICD-10-CM | POA: Diagnosis not present

## 2023-06-11 DIAGNOSIS — Z8673 Personal history of transient ischemic attack (TIA), and cerebral infarction without residual deficits: Secondary | ICD-10-CM | POA: Diagnosis not present

## 2023-06-18 DIAGNOSIS — Z1231 Encounter for screening mammogram for malignant neoplasm of breast: Secondary | ICD-10-CM | POA: Diagnosis not present

## 2023-06-20 ENCOUNTER — Encounter: Payer: Self-pay | Admitting: Cardiology

## 2023-06-20 ENCOUNTER — Ambulatory Visit: Payer: Medicare Other | Attending: Cardiology | Admitting: Cardiology

## 2023-06-20 VITALS — BP 114/82 | HR 90 | Ht 68.0 in | Wt 217.6 lb

## 2023-06-20 DIAGNOSIS — E782 Mixed hyperlipidemia: Secondary | ICD-10-CM

## 2023-06-20 DIAGNOSIS — I25119 Atherosclerotic heart disease of native coronary artery with unspecified angina pectoris: Secondary | ICD-10-CM | POA: Diagnosis not present

## 2023-06-20 DIAGNOSIS — I10 Essential (primary) hypertension: Secondary | ICD-10-CM

## 2023-06-20 NOTE — Progress Notes (Signed)
    Cardiology Office Note  Date: 06/20/2023   ID: Audrey Craig, DOB 1950-08-24, MRN [id number]  History of Present Illness: Audrey Craig is a 71 y.o. female last seen in May.  She is here for a routine visit.  Reports no angina or nitroglycerin use in the interim, stable NYHA class II dyspnea.  She has functional limitations related to right hip and left knee pain.  Reports pending orthopedic consultation in [place] sometime after the first of the year.  I reviewed her medications.  Current cardiac regimen includes Plavix, Lipitor, omega-3 supplements, Zestoretic, Toprol XL, and as needed nitroglycerin.  LDL was 62 in August of this year.  I reviewed her ECG today which shows sinus rhythm with low voltage, decreased R wave progression.  Physical Exam: VS:  BP 114/82 (BP Location: Left Arm)   Pulse 90   Ht 5\' 8"  (1.727 m)   Wt 217 lb 9.6 oz (98.7 kg)   SpO2 95%   BMI 33.09 kg/m , BMI Body mass index is 33.09 kg/m.  Wt Readings from Last 3 Encounters:  06/20/23 217 lb 9.6 oz (98.7 kg)  12/11/22 211 lb (95.7 kg)  05/19/22 206 lb 6.4 oz (93.6 kg)    General: Patient appears comfortable at rest. HEENT: Conjunctiva and lids normal. Neck: Supple, no elevated JVP or carotid bruits. Lungs: Clear to auscultation, nonlabored breathing at rest. Cardiac: Regular rate and rhythm, no S3 or significant systolic murmur. Extremities: No pitting edema.  ECG:  An ECG dated 05/19/2022 was personally reviewed today and demonstrated:  Sinus rhythm with low voltage.  Labwork:  August 2024: BUN 13, creatinine 0.79, potassium 4.6, cholesterol 138, triglycerides 204, HDL 42, LDL 62  Other Studies Reviewed Today:  No interval cardiac testing for review today.  Assessment and Plan:  1.  CAD status post angioplasty of the obtuse marginal in 1999.  Follow-up Lexiscan Myoview in 2019 was negative for ischemia with normal LVEF.  ECG reviewed and stable.  Plan to continue observation on medical  therapy.  Continue Plavix, Lipitor, Toprol-XL, and as needed nitroglycerin.   2.  Mixed hyperlipidemia on Lipitor.  LDL 62 in August of this year.   3.  Primary hypertension.  Blood pressure is well-controlled today.  Continue Zestoretic and Toprol-XL.  Disposition:  Follow up  6 months.  Signed, Jonelle Sidle, M.D., F.A.C.C. [facility] at [facility]

## 2023-06-20 NOTE — Patient Instructions (Addendum)

## 2023-07-19 ENCOUNTER — Other Ambulatory Visit (INDEPENDENT_AMBULATORY_CARE_PROVIDER_SITE_OTHER): Payer: Medicare Other

## 2023-07-19 ENCOUNTER — Other Ambulatory Visit (INDEPENDENT_AMBULATORY_CARE_PROVIDER_SITE_OTHER): Payer: Self-pay

## 2023-07-19 ENCOUNTER — Ambulatory Visit (INDEPENDENT_AMBULATORY_CARE_PROVIDER_SITE_OTHER): Payer: Medicare Other | Admitting: Orthopaedic Surgery

## 2023-07-19 ENCOUNTER — Encounter: Payer: Self-pay | Admitting: Orthopaedic Surgery

## 2023-07-19 VITALS — Ht 67.0 in | Wt 217.0 lb

## 2023-07-19 DIAGNOSIS — G8929 Other chronic pain: Secondary | ICD-10-CM

## 2023-07-19 DIAGNOSIS — M25562 Pain in left knee: Secondary | ICD-10-CM | POA: Diagnosis not present

## 2023-07-19 DIAGNOSIS — M25551 Pain in right hip: Secondary | ICD-10-CM

## 2023-07-19 NOTE — Progress Notes (Addendum)
Office Visit Note   Patient: Audrey Craig           Date of Birth: 10/22/1950           MRN: [id number] Visit Date: 07/19/2023              Requested by: Toma Deiters, MD [address] [place],  [place] [address] PCP: Toma Deiters, MD   Assessment & Plan: Visit Diagnoses:  1. Pain in right hip   2. Chronic pain of left knee     Plan: Will have him set up appointment with Dr. Allie Bossier to discuss right total of arthroplasty direct anterior approach.  We discussed the surgery details and she states she is having great difficulty walking and needs to proceed.  I discussed with her that be retiring at the end of March and that Dr. Magnus Ivan will take good care of her hip arthroplasty.  Follow-Up Instructions: No follow-ups on file.   Orders:  Orders Placed This Encounter  Procedures   XR HIP UNILAT W OR W/O PELVIS 2-3 VIEWS RIGHT   XR KNEE 3 VIEW LEFT   No orders of the defined types were placed in this encounter.     Procedures: No procedures performed   Clinical Data: No additional findings.   Subjective: Chief Complaint  Patient presents with   Right Hip - Pain   Left Knee - Pain    HPI 72 year old female with progressive right hip osteoarthritis.  She was told she needed right hip arthroplasty by Dr. Case at [facility] 4 years ago.  She was considering it and surgery was delayed due to COVID.  She has used a cane has had some problems with knee osteoarthritis with her right hip is much more severe.  She has a cardiologist Dr. Diona Browner who told her she was safe to proceed with surgery.  She has groin pain she has had intra-articular injections with steroids in the past.  Previous left knee arthroscopy.  Patient states she wants to proceed with right total knee arthroplasty.  Review of Systems past history of coronary disease hyperlipidemia.  Knee arthritis and severe right hip arthritis.   Objective: Vital Signs: Ht 5\' 7"  (1.702 m)   Wt 217  lb (98.4 kg)   BMI 33.99 kg/m   Physical Exam Constitutional:      Appearance: She is well-developed.  HENT:     Head: Normocephalic.     Right Ear: External ear normal.     Left Ear: External ear normal. There is no impacted cerumen.  Eyes:     Pupils: Pupils are equal, round, and reactive to light.  Neck:     Thyroid: No thyromegaly.     Trachea: No tracheal deviation.  Cardiovascular:     Rate and Rhythm: Normal rate.  Pulmonary:     Effort: Pulmonary effort is normal.  Abdominal:     Palpations: Abdomen is soft.  Musculoskeletal:     Cervical back: No rigidity.  Skin:    General: Skin is warm and dry.  Neurological:     Mental Status: She is alert and oriented to person, place, and time.  Psychiatric:        Behavior: Behavior normal.     Ortho Exam patient has minimal internal rotation right hip reproducing her pain right groin pain.  No hip flexion contracture pain with external rotation of the right hip.  Opposite left hip has internal/external rotation without pain.  Some knee crepitus right and left.  Specialty Comments:  No specialty comments available.  Imaging: No results found.   PMFS History: Patient Active Problem List   Diagnosis Date Noted   Dizziness and giddiness 10/13/2014   Numbness 10/13/2014   Abnormal brain MRI 10/13/2014   Urinary incontinence 10/13/2014   Weakness of right leg 10/13/2014   Ataxic gait 10/13/2014   Mixed hyperlipidemia 06/07/2010   Coronary atherosclerosis of native coronary artery 06/07/2010   Past Medical History:  Diagnosis Date   COPD (chronic obstructive pulmonary disease) (HCC)    Coronary atherosclerosis of native coronary artery    PCTA obtuse marginal 5/99 - previous [facility] patient   COVID    Hearing loss    Hyperlipidemia    Hypertension    NSTEMI (non-ST elevated myocardial infarction) (HCC)    1999   Vision abnormalities     Family History  Problem Relation Age of Onset   COPD Father    Heart  disease Father    Diabetes type II Mother     Past Surgical History:  Procedure Laterality Date   ABDOMINAL HYSTERECTOMY     BACK SURGERY     BREAST LUMPECTOMY     CHOLECYSTECTOMY     Social History   Occupational History   Not on file  Tobacco Use   Smoking status: Former    Current packs/day: 0.50    Average packs/day: 0.5 packs/day for 40.0 years (20.0 ttl pk-yrs)    Types: Cigarettes    Start date: 05/26/2012   Smokeless tobacco: Never   Tobacco comments:    stopped x 7 months, but the last week - has had a few.  Stopped again yesterday.  Substance and Sexual Activity   Alcohol use: No    Alcohol/week: 0.0 standard drinks of alcohol   Drug use: No   Sexual activity: Not on file

## 2023-07-22 NOTE — Addendum Note (Signed)
Addended by: Eldred Manges on: 07/22/2023 02:17 PM   Modules accepted: Level of Service

## 2023-08-15 ENCOUNTER — Other Ambulatory Visit (INDEPENDENT_AMBULATORY_CARE_PROVIDER_SITE_OTHER): Payer: Medicare Other

## 2023-08-15 ENCOUNTER — Ambulatory Visit: Payer: Medicare Other | Admitting: Orthopaedic Surgery

## 2023-08-15 VITALS — Ht 67.0 in | Wt 215.6 lb

## 2023-08-15 DIAGNOSIS — M25551 Pain in right hip: Secondary | ICD-10-CM

## 2023-08-15 DIAGNOSIS — M1611 Unilateral primary osteoarthritis, right hip: Secondary | ICD-10-CM | POA: Insufficient documentation

## 2023-08-15 NOTE — Progress Notes (Signed)
The patient is a very pleasant 73 year old female sent to me by my partner Dr. Ophelia Charter due to severe right hip osteoarthritis.  She has been having worsening hip pain for over 4 years now and is in need of a hip replacement.  She does occasionally use an assistive device when she walks.  She has well-documented severe arthritis of her right hip.  She is very hard of hearing.  She is on Plavix.  I was able to review all of her medications and past medical history within epic.  She is followed by cardiology regularly as well.  At this point her right hip pain is daily and it is 10 out of 10.  It is detrimentally affecting her mobility, her quality of life and her actives daily living.  Examination of her left hip is entirely normal with good range of motion.  Examination of her right hip shows severe and significant limitations in range of motion as well as stiffness and severe pain in the groin.  She reports a constant groin pain with the right side.  Standing AP pelvis shows severe end-stage arthritis of the right hip.  The joint space is completely gone in terms of bone-on-bone wear and sclerotic changes and large osteophytes around the right hip.  The left hip joint space is well-maintained.  We had a long and thorough discussion about hip replacement surgery.  I went over her x-rays and a hip replacement model.  The risks and benefits of surgery were discussed in detail.  We talked about what to expect from an intraoperative and postoperative standpoint.  She will need to stop Plavix 1 week before surgery.  She is hoping to have this scheduled in the near future so we will be in touch.  She does have our surgery scheduler's card.

## 2023-09-10 DIAGNOSIS — K219 Gastro-esophageal reflux disease without esophagitis: Secondary | ICD-10-CM | POA: Diagnosis not present

## 2023-09-10 DIAGNOSIS — Z8673 Personal history of transient ischemic attack (TIA), and cerebral infarction without residual deficits: Secondary | ICD-10-CM | POA: Diagnosis not present

## 2023-09-10 DIAGNOSIS — M1710 Unilateral primary osteoarthritis, unspecified knee: Secondary | ICD-10-CM | POA: Diagnosis not present

## 2023-09-10 DIAGNOSIS — N182 Chronic kidney disease, stage 2 (mild): Secondary | ICD-10-CM | POA: Diagnosis not present

## 2023-09-10 DIAGNOSIS — I10 Essential (primary) hypertension: Secondary | ICD-10-CM | POA: Diagnosis not present

## 2023-09-10 DIAGNOSIS — Z0000 Encounter for general adult medical examination without abnormal findings: Secondary | ICD-10-CM | POA: Diagnosis not present

## 2023-09-10 DIAGNOSIS — J449 Chronic obstructive pulmonary disease, unspecified: Secondary | ICD-10-CM | POA: Diagnosis not present

## 2023-09-10 DIAGNOSIS — I2510 Atherosclerotic heart disease of native coronary artery without angina pectoris: Secondary | ICD-10-CM | POA: Diagnosis not present

## 2023-09-10 DIAGNOSIS — J301 Allergic rhinitis due to pollen: Secondary | ICD-10-CM | POA: Diagnosis not present

## 2023-09-18 NOTE — Pre-Procedure Instructions (Signed)
 Surgical Instructions   Your procedure is scheduled on September 25, 2023. Report to [facility] Main Entrance "A" at 8:15 A.M., then check in with the Admitting office. Any questions or running late day of surgery: call [phone]  Questions prior to your surgery date: call [phone], Monday-Friday, 8am-4pm. If you experience any cold or flu symptoms such as cough, fever, chills, shortness of breath, etc. between now and your scheduled surgery, please notify us at the above number.     Remember:  Do not eat after midnight the night before your surgery  You may drink clear liquids until 7:15 AM the morning of your surgery.   Clear liquids allowed are: Water, Non-Citrus Juices (without pulp), Carbonated Beverages, Clear Tea (no milk, honey, etc.), Black Coffee Only (NO MILK, CREAM OR POWDERED CREAMER of any kind), and Gatorade.  Patient Instructions  The night before surgery:  No food after midnight. ONLY clear liquids after midnight  The day of surgery (if you do NOT have diabetes):  Drink ONE (1) Pre-Surgery Clear Ensure by 7:15 AM the morning of surgery. Drink in one sitting. Do not sip.  This drink was given to you during your hospital  pre-op appointment visit.  Nothing else to drink after completing the  Pre-Surgery Clear Ensure.         If you have questions, please contact your surgeon's office.    Take these medicines the morning of surgery with A SIP OF WATER: acetaminophen (TYLENOL)  atorvastatin (LIPITOR)  Budeson-Glycopyrrol-Formoterol (BREZTRI AEROSPHERE)  DULoxetine (CYMBALTA)  gabapentin (NEURONTIN)  loratadine (CLARITIN)    May take these medicines IF NEEDED: albuterol (VENTOLIN HFA) inhaler - please bring inhaler with you morning of surgery nitroGLYCERIN (NITROSTAT) - if dose taken prior to surgery, please call either of the above phone numbers   STOP taking your clopidogrel (PLAVIX) one week prior to surgery. Your last dose will be February  24th.   One week prior to surgery, STOP taking any Aspirin (unless otherwise instructed by your surgeon) Aleve, Naproxen, Ibuprofen, Motrin, Advil, Goody's, BC's, all herbal medications, fish oil, and non-prescription vitamins.                     Do NOT Smoke (Tobacco/Vaping) for 24 hours prior to your procedure.  If you use a CPAP at night, you may bring your mask/headgear for your overnight stay.   You will be asked to remove any contacts, glasses, piercing's, hearing aid's, dentures/partials prior to surgery. Please bring cases for these items if needed.    Patients discharged the day of surgery will not be allowed to drive home, and someone needs to stay with them for 24 hours.  SURGICAL WAITING ROOM VISITATION Patients may have no more than 2 support people in the waiting area - these visitors may rotate.   Pre-op nurse will coordinate an appropriate time for 1 ADULT support person, who may not rotate, to accompany patient in pre-op.  Children under the age of 55 must have an adult with them who is not the patient and must remain in the main waiting area with an adult.  If the patient needs to stay at the hospital during part of their recovery, the visitor guidelines for inpatient rooms apply.  Please refer to the [facility] website for the visitor guidelines for any additional information.   If you received a COVID test during your pre-op visit  it is requested that you wear a mask when out in public, stay away  from anyone that may not be feeling well and notify your surgeon if you develop symptoms. If you have been in contact with anyone that has tested positive in the last 10 days please notify you surgeon.      Pre-operative 5 CHG Bathing Instructions   You can play a key role in reducing the risk of infection after surgery. Your skin needs to be as free of germs as possible. You can reduce the number of germs on your skin by washing with CHG (chlorhexidine gluconate) soap  before surgery. CHG is an antiseptic soap that kills germs and continues to kill germs even after washing.   DO NOT use if you have an allergy to chlorhexidine/CHG or antibacterial soaps. If your skin becomes reddened or irritated, stop using the CHG and notify one of our RNs at [phone].   Please shower with the CHG soap starting 4 days before surgery using the following schedule:     Please keep in mind the following:  DO NOT shave, including legs and underarms, starting the day of your first shower.   You may shave your face at any point before/day of surgery.  Place clean sheets on your bed the day you start using CHG soap. Use a clean washcloth (not used since being washed) for each shower. DO NOT sleep with pets once you start using the CHG.   CHG Shower Instructions:  Wash your face and private area with normal soap. If you choose to wash your hair, wash first with your normal shampoo.  After you use shampoo/soap, rinse your hair and body thoroughly to remove shampoo/soap residue.  Turn the water OFF and apply about 3 tablespoons (45 ml) of CHG soap to a CLEAN washcloth.  Apply CHG soap ONLY FROM YOUR NECK DOWN TO YOUR TOES (washing for 3-5 minutes)  DO NOT use CHG soap on face, private areas, open wounds, or sores.  Pay special attention to the area where your surgery is being performed.  If you are having back surgery, having someone wash your back for you may be helpful. Wait 2 minutes after CHG soap is applied, then you may rinse off the CHG soap.  Pat dry with a clean towel  Put on clean clothes/pajamas   If you choose to wear lotion, please use ONLY the CHG-compatible lotions that are listed below.  Additional instructions for the day of surgery: DO NOT APPLY any lotions, deodorants, cologne, or perfumes.   Do not bring valuables to the hospital. [facility] is not responsible for any belongings/valuables. Do not wear nail polish, gel polish, artificial nails, or any  other type of covering on natural nails (fingers and toes) Do not wear jewelry or makeup Put on clean/comfortable clothes.  Please brush your teeth.  Ask your nurse before applying any prescription medications to the skin.     CHG Compatible Lotions   Aveeno Moisturizing lotion  Cetaphil Moisturizing Cream  Cetaphil Moisturizing Lotion  Clairol Herbal Essence Moisturizing Lotion, Dry Skin  Clairol Herbal Essence Moisturizing Lotion, Extra Dry Skin  Clairol Herbal Essence Moisturizing Lotion, Normal Skin  Curel Age Defying Therapeutic Moisturizing Lotion with Alpha Hydroxy  Curel Extreme Care Body Lotion  Curel Soothing Hands Moisturizing Hand Lotion  Curel Therapeutic Moisturizing Cream, Fragrance-Free  Curel Therapeutic Moisturizing Lotion, Fragrance-Free  Curel Therapeutic Moisturizing Lotion, Original Formula  Eucerin Daily Replenishing Lotion  Eucerin Dry Skin Therapy Plus Alpha Hydroxy Crme  Eucerin Dry Skin Therapy Plus Alpha Hydroxy Lotion  Eucerin Original  Crme  Eucerin Original Lotion  Eucerin Plus Crme Eucerin Plus Lotion  Eucerin TriLipid Replenishing Lotion  Keri Anti-Bacterial Hand Lotion  Keri Deep Conditioning Original Lotion Dry Skin Formula Softly Scented  Keri Deep Conditioning Original Lotion, Fragrance Free Sensitive Skin Formula  Keri Lotion Fast Absorbing Fragrance Free Sensitive Skin Formula  Keri Lotion Fast Absorbing Softly Scented Dry Skin Formula  Keri Original Lotion  Keri Skin Renewal Lotion Keri Silky Smooth Lotion  Keri Silky Smooth Sensitive Skin Lotion  Nivea Body Creamy Conditioning Oil  Nivea Body Extra Enriched Lotion  Nivea Body Original Lotion  Nivea Body Sheer Moisturizing Lotion Nivea Crme  Nivea Skin Firming Lotion  NutraDerm 30 Skin Lotion  NutraDerm Skin Lotion  NutraDerm Therapeutic Skin Cream  NutraDerm Therapeutic Skin Lotion  ProShield Protective Hand Cream  Provon moisturizing lotion  Please read over the following  fact sheets that you were given.

## 2023-09-19 ENCOUNTER — Telehealth: Payer: Self-pay | Admitting: Cardiology

## 2023-09-19 ENCOUNTER — Other Ambulatory Visit: Payer: Self-pay

## 2023-09-19 ENCOUNTER — Encounter (HOSPITAL_COMMUNITY): Payer: Self-pay

## 2023-09-19 ENCOUNTER — Encounter (HOSPITAL_COMMUNITY)
Admission: RE | Admit: 2023-09-19 | Discharge: 2023-09-19 | Disposition: A | Discharge status: Home / Self Care | Payer: Medicare Other | Source: Ambulatory Visit | Attending: Orthopaedic Surgery | Admitting: Orthopaedic Surgery

## 2023-09-19 VITALS — BP 125/100 | HR 85 | Temp 98.0°F | Resp 18 | Ht 68.0 in | Wt 215.0 lb

## 2023-09-19 DIAGNOSIS — Z01812 Encounter for preprocedural laboratory examination: Secondary | ICD-10-CM | POA: Insufficient documentation

## 2023-09-19 DIAGNOSIS — M797 Fibromyalgia: Secondary | ICD-10-CM | POA: Diagnosis not present

## 2023-09-19 DIAGNOSIS — I10 Essential (primary) hypertension: Secondary | ICD-10-CM | POA: Diagnosis not present

## 2023-09-19 DIAGNOSIS — Z87891 Personal history of nicotine dependence: Secondary | ICD-10-CM | POA: Diagnosis not present

## 2023-09-19 DIAGNOSIS — I2510 Atherosclerotic heart disease of native coronary artery without angina pectoris: Secondary | ICD-10-CM | POA: Diagnosis not present

## 2023-09-19 DIAGNOSIS — Z01818 Encounter for other preprocedural examination: Secondary | ICD-10-CM

## 2023-09-19 DIAGNOSIS — J449 Chronic obstructive pulmonary disease, unspecified: Secondary | ICD-10-CM | POA: Insufficient documentation

## 2023-09-19 DIAGNOSIS — E785 Hyperlipidemia, unspecified: Secondary | ICD-10-CM | POA: Diagnosis not present

## 2023-09-19 DIAGNOSIS — M1611 Unilateral primary osteoarthritis, right hip: Secondary | ICD-10-CM | POA: Diagnosis not present

## 2023-09-19 DIAGNOSIS — I252 Old myocardial infarction: Secondary | ICD-10-CM | POA: Insufficient documentation

## 2023-09-19 DIAGNOSIS — Z8616 Personal history of COVID-19: Secondary | ICD-10-CM | POA: Insufficient documentation

## 2023-09-19 HISTORY — DX: Personal history of other diseases of the digestive system: Z87.19

## 2023-09-19 HISTORY — DX: Unspecified osteoarthritis, unspecified site: M19.90

## 2023-09-19 HISTORY — DX: Personal history of urinary calculi: Z87.442

## 2023-09-19 HISTORY — DX: Anxiety disorder, unspecified: F41.9

## 2023-09-19 HISTORY — DX: Depression, unspecified: F32.A

## 2023-09-19 HISTORY — DX: Dyspnea, unspecified: R06.00

## 2023-09-19 HISTORY — DX: Fibromyalgia: M79.7

## 2023-09-19 LAB — CBC
HCT: 42.6 % (ref 36.0–46.0)
Hemoglobin: 13.9 g/dL (ref 12.0–15.0)
MCH: 30.3 pg (ref 26.0–34.0)
MCHC: 32.6 g/dL (ref 30.0–36.0)
MCV: 92.8 fL (ref 80.0–100.0)
Platelets: 188 10*3/uL (ref 150–400)
RBC: 4.59 MIL/uL (ref 3.87–5.11)
RDW: 12.6 % (ref 11.5–15.5)
WBC: 8.7 10*3/uL (ref 4.0–10.5)
nRBC: 0 % (ref 0.0–0.2)

## 2023-09-19 LAB — BASIC METABOLIC PANEL
Anion gap: 9 (ref 5–15)
BUN: 14 mg/dL (ref 8–23)
CO2: 25 mmol/L (ref 22–32)
Calcium: 8.9 mg/dL (ref 8.9–10.3)
Chloride: 104 mmol/L (ref 98–111)
Creatinine, Ser: 0.86 mg/dL (ref 0.44–1.00)
GFR, Estimated: >60 mL/min (ref >60)
Glucose, Bld: 100 mg/dL — ABNORMAL HIGH (ref 70–99)
Potassium: 3.5 mmol/L (ref 3.5–5.1)
Sodium: 138 mmol/L (ref 135–145)

## 2023-09-19 LAB — SURGICAL PCR SCREEN
MRSA, PCR: NEGATIVE
Staphylococcus aureus: NEGATIVE

## 2023-09-19 LAB — TYPE AND SCREEN
ABO/RH(D): A POS
Antibody Screen: NEGATIVE

## 2023-09-19 NOTE — Telephone Encounter (Signed)
   Pre-operative Risk Assessment    Patient Name: Audrey Craig  DOB: [birth year] MRN: [id number]      Request for Surgical Clearance    Procedure:   Right total hip arthroplasty  Date of Surgery:  Clearance 09/25/23                                 Surgeon:  Doneen Poisson Surgeon's Group or Practice Name:  [facility] Phone number:  [phone] Fax number:  [phone]   Type of Clearance Requested:   - Medical  - Pharmacy:  Hold Clopidogrel (Plavix) hold 1 week prior to surgery   Type of Anesthesia:  Spinal   Additional requests/questions:  Please fax a copy of clearance to the surgeon's office.  Neita Garnet   09/19/2023, 4:42 PM

## 2023-09-19 NOTE — Progress Notes (Signed)
 PCP - Dr. Annette Stable A. Hasanaj Cardiologist - Dr. Nona Dell - Last office visit 06/20/2023  PPM/ICD - Denies Device Orders - n/a Rep Notified - n/a  Chest x-ray - n/a EKG - 06/20/2023 Stress Test - 08/01/2017 ECHO - Denies Cardiac Cath - 04/18/2000 - Previous one in 1999  Sleep Study - Denies CPAP - n/a  No DM  Last dose of GLP1 agonist-  n/a GLP1 instructions: n/a  Blood Thinner Instructions: Pt instructed to stop Plavix 7 days prior to surgery. Her last dose was February 24th Aspirin Instructions: n/a  ERAS Protcol - Clear liquids until 0715 morning of surgery PRE-SURGERY Ensure or G2- Ensure given to pt with instructions  COVID TEST- n/a   Anesthesia review: Yes. Cardiac hx including CAD/MI with stents, HTN, COPD   Patient denies shortness of breath, fever, cough and chest pain at PAT appointment. Pt denies any respiratory illness/infection in the last two months.   All instructions explained to the patient, with a verbal understanding of the material. Patient agrees to go over the instructions while at home for a better understanding. Patient also instructed to self quarantine after being tested for COVID-19. The opportunity to ask questions was provided.

## 2023-09-20 ENCOUNTER — Telehealth: Payer: Self-pay | Admitting: *Deleted

## 2023-09-20 ENCOUNTER — Ambulatory Visit: Payer: Medicare Other | Attending: Nurse Practitioner | Admitting: Emergency Medicine

## 2023-09-20 DIAGNOSIS — Z01810 Encounter for preprocedural cardiovascular examination: Secondary | ICD-10-CM | POA: Diagnosis not present

## 2023-09-20 NOTE — Telephone Encounter (Signed)
 Pt is scheduled for a tele visit today, 2:27/25 3:20.  Consent on file / medications reconciled.

## 2023-09-20 NOTE — Anesthesia Preprocedure Evaluation (Addendum)
 Anesthesia Evaluation  Patient identified by MRN, date of birth, ID band Patient awake    Reviewed: Allergy & Precautions, NPO status , Patient's Chart, lab work & pertinent test results  Airway Mallampati: II  TM Distance: >3 FB     Dental  (+) Dental Advisory Given, Missing, Caps, Edentulous Lower, Lower Dentures, Partial Upper,    Pulmonary shortness of breath and with exertion, COPD,  COPD inhaler, former smoker   Pulmonary exam normal breath sounds clear to auscultation       Cardiovascular hypertension, Pt. on medications and Pt. on home beta blockers + CAD and + Past MI  Normal cardiovascular exam Rhythm:Regular Rate:Normal  NSTEMI 1999  PCTA 5/'99 Obtuse marginal  EKG 06/20/23 NSR, low voltage   Neuro/Psych  PSYCHIATRIC DISORDERS Anxiety Depression    HOH bilateral  Neuromuscular disease    GI/Hepatic Neg liver ROS, hiatal hernia,,,  Endo/Other  Obesity  Renal/GU negative Renal ROS Bladder dysfunction  Urinary incontinence    Musculoskeletal  (+) Arthritis , Osteoarthritis,  Fibromyalgia -OA right hip   Abdominal  (+) + obese  Peds  Hematology Plavix therapy- last dose 2/25   Anesthesia Other Findings   Reproductive/Obstetrics                             Anesthesia Physical Anesthesia Plan  ASA: 3  Anesthesia Plan: Spinal   Post-op Pain Management: Minimal or no pain anticipated and Dilaudid IV   Induction: Intravenous  PONV Risk Score and Plan: 3 and Treatment may vary due to age or medical condition and Propofol infusion  Airway Management Planned: Natural Airway and Simple Face Mask  Additional Equipment: None  Intra-op Plan:   Post-operative Plan:   Informed Consent: I have reviewed the patients History and Physical, chart, labs and discussed the procedure including the risks, benefits and alternatives for the proposed anesthesia with the patient or authorized  representative who has indicated his/her understanding and acceptance.     Dental advisory given  Plan Discussed with: CRNA and Anesthesiologist  Anesthesia Plan Comments: (PAT note written 09/20/2023 by Shonna Chock, PA-C.  )       Anesthesia Quick Evaluation

## 2023-09-20 NOTE — Progress Notes (Signed)
 Anesthesia Chart Review:  Case: [id number] Date/Time: 09/25/23 1000   Procedure: RIGHT TOTAL HIP ARTHROPLASTY ANTERIOR APPROACH (Right: Hip)   Anesthesia type: Spinal   Pre-op diagnosis: osteoarthritis right hip   Location: [facility] ROOM 02 / [facility]   Surgeons: Kathryne Hitch, MD       DISCUSSION: Patient is a 73 year old female scheduled for the above procedure.  History includes former smoker (quit 07/24/20), COPD, HTN, HLD, CAD (NSTEMI, s/p PTCA OM 11/1997), fibromyalgia, hiatal hernia, hard of hearing, dyspnea, spinal surgery.   Last office visit with cardiologist Dr. Diona Browner was on 06/20/23 for follow-up CAD. S/p PTCA OM 1999. Non-ischemic stress test, EF > 65% in 2019. Stable NYHA class II dyspnea. Some functional limitations due to right hip and left knee pain. Had pending orth evaluation at that time. Continue current therapy for CAD recommended. She denied Nitroglycerin use at PAT.   She has telephonic preoperative cardiology evaluation on 09/20/23 with Rise Paganini, NP who wrote, "Preoperative Cardiovascular Risk Assessment: According to the Revised Cardiac Risk Index (RCRI), her Perioperative Risk of Major Cardiac Event is (%): 0.9. Her Functional Capacity in METs is: 6.05 according to the Duke Activity Status Index (DASI). Therefore, based on ACC/AHA guidelines, patient would be at acceptable risk for the planned procedure without further cardiovascular testing... Per Dr. Diona Browner, pt may hold Plavix for 7 days prior to surgery. Please resume Plavix as soon as possible postprocedure, at the discretion of the surgeon." Last Plavix reported as 09/17/23.  Anesthesia team to evaluate on the day of surgery.     VS: BP (!) 125/100   Pulse 85   Temp 36.7 C   Resp 18   Ht 5\' 8"  (1.727 m)   Wt 97.5 kg   SpO2 96%   BMI 32.69 kg/m  BP Readings from Last 3 Encounters:  09/19/23 (!) 125/100  06/20/23 114/82  12/11/22 118/68     PROVIDERS: Toma Deiters, MD is PCP   Nona Dell, MD is cardiologist   LABS: Labs reviewed: Acceptable for surgery. (all labs ordered are listed, but only abnormal results are displayed)  Labs Reviewed  BASIC METABOLIC PANEL - Abnormal; Notable for the following components:      Result Value   Glucose, Bld 100 (*)    All other components within normal limits  SURGICAL PCR SCREEN  CBC  TYPE AND SCREEN    EKG: 06/20/23: Normal sinus rhythm Low voltage QRS Confirmed by Nona Dell [phone]) on 06/20/2023 11:15:13 AM   CV: US Carotid 08/25/22 [facility]): - Color duplex indicates minimal homogeneous plaque, with no  hemodynamically significant stenosis by duplex criteria in the  extracranial cerebrovascular circulation.  - Note that the upper extremity pressures are asymmetric, greater on  the left. Office based assessment of upper extremity blood pressures  may be useful, or if imaging is considered, potentially CTA chest.   Nuclear stress test 08/01/17: There was no ST segment deviation noted during stress. The study is normal. There are no perfusion defects This is a low risk study. The left ventricular ejection fraction is hyperdynamic (>65%).  Cardiac cath 04/18/00:  CONCLUSION: 1. No significant CAD. 2. Normal left ventricular systolic function. 3. No evidence of renal artery stenosis.  Past Medical History:  Diagnosis Date   Anxiety    Arthritis    COPD (chronic obstructive pulmonary disease) (HCC)    Coronary atherosclerosis of native coronary artery    PCTA obtuse marginal 5/99 - previous [facility] patient  COVID    Depression    Dyspnea    Fibromyalgia    Hearing loss    History of hiatal hernia    History of kidney stones    Hyperlipidemia    Hypertension    NSTEMI (non-ST elevated myocardial infarction) (HCC)    1999   Vision abnormalities     Past Surgical History:  Procedure Laterality Date   ABDOMINAL HYSTERECTOMY     BACK SURGERY     BREAST LUMPECTOMY Right     CHOLECYSTECTOMY     COLONOSCOPY     KNEE ARTHROSCOPY Left    TUBAL LIGATION      MEDICATIONS:  acetaminophen (TYLENOL) 500 MG tablet   albuterol (VENTOLIN HFA) 108 (90 Base) MCG/ACT inhaler   atorvastatin (LIPITOR) 40 MG tablet   Budeson-Glycopyrrol-Formoterol (BREZTRI AEROSPHERE) 160-9-4.8 MCG/ACT AERO   calcium carbonate (OSCAL) 1500 (600 Ca) MG TABS tablet   clopidogrel (PLAVIX) 75 MG tablet   Cranberry 500 MG CAPS   DULoxetine (CYMBALTA) 60 MG capsule   ergocalciferol (VITAMIN D2) 50000 UNITS capsule   fish oil-omega-3 fatty acids 1000 MG capsule   gabapentin (NEURONTIN) 300 MG capsule   lisinopril-hydrochlorothiazide (ZESTORETIC) 10-12.5 MG tablet   loratadine (CLARITIN) 10 MG tablet   metoprolol succinate (TOPROL XL) 25 MG 24 hr tablet   Multiple Vitamins-Minerals (SENIOR MULTIVITAMIN PLUS PO)   nitroGLYCERIN (NITROSTAT) 0.4 MG SL tablet   polycarbophil (FIBERCON) 625 MG tablet   No current facility-administered medications for this encounter.    Shonna Chock, PA-C Surgical Short Stay/Anesthesiology [facility] Phone [phone] [facility] Phone [phone] 09/20/2023 4:16 PM

## 2023-09-20 NOTE — Progress Notes (Signed)
 Virtual Visit via Telephone Note   Because of Audrey Craig co-morbid illnesses, she is at least at moderate risk for complications without adequate follow up.  This format is felt to be most appropriate for this patient at this time.  Due to technical limitations with video connection Web designer), today's appointment will be conducted as an audio only telehealth visit, and Audrey Craig verbally agreed to proceed in this manner.   All issues noted in this document were discussed and addressed.  No physical exam could be performed with this format.  Evaluation Performed:  Preoperative cardiovascular risk assessment _____________   Date:  09/20/2023   Patient ID:  Audrey Craig, DOB 1950/11/28, MRN [id number] Patient Location:  Home Provider location:   Office  Primary Care Provider:  Toma Deiters, MD Primary Cardiologist:  Nona Dell, MD  Chief Complaint / Patient Profile   73 y.o. y/o female with a h/o CAD status post angioplasty of the obtuse marginal in 1999, hyperlipidemia, hypertension who is pending right total hip arthroplasty on 09/25/23 with Dr. Magnus Ivan and presents today for telephonic preoperative cardiovascular risk assessment.  History of Present Illness    Audrey Craig is a 73 y.o. female who presents via audio/video conferencing for a telehealth visit today.  Pt was last seen in cardiology clinic on 06/20/2023 by Dr. Diona Browner.  At that time Audrey Craig was doing well.  The patient is now pending procedure as outlined above. Since her last visit, she denies chest pain, shortness of breath, lower extremity edema, fatigue, palpitations, weakness, presyncope, syncope, orthopnea, and PND.  Past Medical History    Past Medical History:  Diagnosis Date   Anxiety    Arthritis    COPD (chronic obstructive pulmonary disease) (HCC)    Coronary atherosclerosis of native coronary artery    PCTA obtuse marginal 5/99 - previous [facility] patient   COVID    Depression     Dyspnea    Fibromyalgia    Hearing loss    History of hiatal hernia    History of kidney stones    Hyperlipidemia    Hypertension    NSTEMI (non-ST elevated myocardial infarction) (HCC)    1999   Vision abnormalities    Past Surgical History:  Procedure Laterality Date   ABDOMINAL HYSTERECTOMY     BACK SURGERY     BREAST LUMPECTOMY Right    CHOLECYSTECTOMY     COLONOSCOPY     KNEE ARTHROSCOPY Left    TUBAL LIGATION      Allergies  Allergies  Allergen Reactions   Tape Other (See Comments)    Blisters    Home Medications    Prior to Admission medications   Medication Sig Start Date End Date Taking? Authorizing Provider  acetaminophen (TYLENOL) 500 MG tablet Take 1,000 mg by mouth in the morning, at noon, and at bedtime.    [provider]  albuterol (VENTOLIN HFA) 108 (90 Base) MCG/ACT inhaler Inhale 2 puffs into the lungs every 6 (six) hours as needed for wheezing or shortness of breath.    [provider]  atorvastatin (LIPITOR) 40 MG tablet Take 40 mg by mouth daily. 03/06/18   [provider]  Budeson-Glycopyrrol-Formoterol (BREZTRI AEROSPHERE) 160-9-4.8 MCG/ACT AERO Inhale 2 puffs into the lungs daily.    [provider]  calcium carbonate (OSCAL) 1500 (600 Ca) MG TABS tablet Take 600 mg of elemental calcium by mouth daily.    [provider]  clopidogrel (PLAVIX)  75 MG tablet Take 75 mg by mouth daily.    [provider]  Cranberry 500 MG CAPS Take 500 mg by mouth at bedtime.    [provider]  DULoxetine (CYMBALTA) 60 MG capsule Take 60 mg by mouth 2 (two) times daily. 05/01/14   [provider]  ergocalciferol (VITAMIN D2) 50000 UNITS capsule Take 50,000 Units by mouth once a week.      [provider]  fish oil-omega-3 fatty acids 1000 MG capsule Take 3 g by mouth daily.    [provider]  gabapentin (NEURONTIN) 300 MG capsule Take 300 mg by mouth 3 (three) times daily.  06/02/17   [provider]  lisinopril-hydrochlorothiazide (ZESTORETIC) 10-12.5 MG tablet Take 1 tablet by mouth daily. 02/17/21   [provider]  loratadine (CLARITIN) 10 MG tablet Take 10 mg by mouth daily.    [provider]  metoprolol succinate (TOPROL XL) 25 MG 24 hr tablet Take 1 tablet (25 mg total) by mouth daily. Patient taking differently: Take 25 mg by mouth at bedtime. 06/12/17   Jonelle Sidle, MD  Multiple Vitamins-Minerals (SENIOR MULTIVITAMIN PLUS PO) Take 1 tablet by mouth daily.    [provider]  nitroGLYCERIN (NITROSTAT) 0.4 MG SL tablet Place 1 tablet (0.4 mg total) under the tongue every 5 (five) minutes as needed. Up to 3 doses. If no relief after 3rd dose, proceed to ED 05/28/12   Jonelle Sidle, MD  polycarbophil (FIBERCON) 625 MG tablet Take 625 mg by mouth at bedtime.    [provider]    Physical Exam    Vital Signs:  Audrey Craig does not have vital signs available for review today.  Given telephonic nature of communication, physical exam is limited. AAOx3. NAD. Normal affect.  Speech and respirations are unlabored.  Accessory Clinical Findings    None  Assessment & Plan    1.  Preoperative Cardiovascular Risk Assessment: According to the Revised Cardiac Risk Index (RCRI), her Perioperative Risk of Major Cardiac Event is (%): 0.9. Her Functional Capacity in METs is: 6.05 according to the Duke Activity Status Index (DASI). Therefore, based on ACC/AHA guidelines, patient would be at acceptable risk for the planned procedure without further cardiovascular testing.   The patient was advised that if she develops new symptoms prior to surgery to contact our office to arrange for a follow-up visit, and she verbalized understanding.  Per Dr. Diona Browner, pt may hold Plavix for 7 days prior to surgery. Please resume Plavix as soon as possible postprocedure, at the discretion of the surgeon.   A copy of this note  will be routed to requesting surgeon.  Time:   Today, I have spent 8 minutes with the patient with telehealth technology discussing medical history, symptoms, and management plan.     Denyce Robert, NP  09/20/2023, 1:24 PM

## 2023-09-20 NOTE — Telephone Encounter (Signed)
   Name: Audrey Craig  DOB: [birth year]  MRN: [id number]  Primary Cardiologist: Nona Dell, MD   Preoperative team, please contact this patient and set up a phone call appointment for further preoperative risk assessment. Please obtain consent and complete medication review. Thank you for your help.  I confirm that guidance regarding antiplatelet and oral anticoagulation therapy has been completed and, if necessary, noted below.  Per Dr. Diona Browner, pt may hold Plavix for 7 days prior to surgery. Please resume Plavix as soon as possible postprocedure, at the discretion of the surgeon.   I also confirmed the patient resides in the state of [place]. As per [facility] Medical Board telemedicine laws, the patient must reside in the state in which the provider is licensed.   Joylene Grapes, NP 09/20/2023, 9:58 AM [facility]

## 2023-09-20 NOTE — Telephone Encounter (Signed)
 Pt is scheduled for a tele visit today, 2:27/25 3:20.  Consent on file / medications reconciled.     Patient Consent for Virtual Visit        Audrey Craig has provided verbal consent on 09/20/2023 for a virtual visit (video or telephone).   CONSENT FOR VIRTUAL VISIT FOR:  Audrey Craig  By participating in this virtual visit I agree to the following:  I hereby voluntarily request, consent and authorize [facility] and its employed or contracted physicians, physician assistants, nurse practitioners or other licensed health care professionals (the Practitioner), to provide me with telemedicine health care services (the "Services") as deemed necessary by the treating Practitioner. I acknowledge and consent to receive the Services by the Practitioner via telemedicine. I understand that the telemedicine visit will involve communicating with the Practitioner through live audiovisual communication technology and the disclosure of certain medical information by electronic transmission. I acknowledge that I have been given the opportunity to request an in-person assessment or other available alternative prior to the telemedicine visit and am voluntarily participating in the telemedicine visit.  I understand that I have the right to withhold or withdraw my consent to the use of telemedicine in the course of my care at any time, without affecting my right to future care or treatment, and that the Practitioner or I may terminate the telemedicine visit at any time. I understand that I have the right to inspect all information obtained and/or recorded in the course of the telemedicine visit and may receive copies of available information for a reasonable fee.  I understand that some of the potential risks of receiving the Services via telemedicine include:  Delay or interruption in medical evaluation due to technological equipment failure or disruption; Information transmitted may not be sufficient  (e.g. poor resolution of images) to allow for appropriate medical decision making by the Practitioner; and/or  In rare instances, security protocols could fail, causing a breach of personal health information.  Furthermore, I acknowledge that it is my responsibility to provide information about my medical history, conditions and care that is complete and accurate to the best of my ability. I acknowledge that Practitioner's advice, recommendations, and/or decision may be based on factors not within their control, such as incomplete or inaccurate data provided by me or distortions of diagnostic images or specimens that may result from electronic transmissions. I understand that the practice of medicine is not an exact science and that Practitioner makes no warranties or guarantees regarding treatment outcomes. I acknowledge that a copy of this consent can be made available to me via my patient portal [facility] MyChart), or I can request a printed copy by calling the office of  [facility].    I understand that my insurance will be billed for this visit.   I have read or had this consent read to me. I understand the contents of this consent, which adequately explains the benefits and risks of the Services being provided via telemedicine.  I have been provided ample opportunity to ask questions regarding this consent and the Services and have had my questions answered to my satisfaction. I give my informed consent for the services to be provided through the use of telemedicine in my medical care

## 2023-09-21 ENCOUNTER — Telehealth: Payer: Medicare Other

## 2023-09-24 ENCOUNTER — Telehealth: Payer: Self-pay | Admitting: *Deleted

## 2023-09-24 NOTE — Care Plan (Signed)
 [facility] RNCM call to patient to discuss her upcoming Right total hip arthroplasty with Dr. Magnus Ivan on 09/25/23 at [facility]. She is agreeable to case management. She lives with her cousin, who assists her as needed. Plan is to return home after discharge. She has a RW already. Anticipate HHPT will be needed after a short hospital stay. Referral made to [facility]- this is still pending. Reviewed post op care instructions. Will continue to follow for needs.

## 2023-09-24 NOTE — H&P (Signed)
 TOTAL HIP ADMISSION H&P  Patient is admitted for right total hip arthroplasty.  Subjective:  Chief Complaint: right hip pain  HPI: Audrey Craig, 73 y.o. female, has a history of pain and functional disability in the right hip(s) due to arthritis and patient has failed non-surgical conservative treatments for greater than 12 weeks to include NSAID's and/or analgesics, corticosteriod injections, flexibility and strengthening excercises, use of assistive devices, weight reduction as appropriate, and activity modification.  Onset of symptoms was gradual starting 4 years ago with gradually worsening course since that time.The patient noted no past surgery on the right hip(s).  Patient currently rates pain in the right hip at 10 out of 10 with activity. Patient has night pain, worsening of pain with activity and weight bearing, trendelenberg gait, pain that interfers with activities of daily living, and pain with passive range of motion. Patient has evidence of subchondral sclerosis, periarticular osteophytes, and joint space narrowing by imaging studies. This condition presents safety issues increasing the risk of falls.  There is no current active infection.  Patient Active Problem List   Diagnosis Date Noted   Unilateral primary osteoarthritis, right hip 08/15/2023   Dizziness and giddiness 10/13/2014   Numbness 10/13/2014   Abnormal brain MRI 10/13/2014   Urinary incontinence 10/13/2014   Weakness of right leg 10/13/2014   Ataxic gait 10/13/2014   Mixed hyperlipidemia 06/07/2010   Coronary atherosclerosis of native coronary artery 06/07/2010   Past Medical History:  Diagnosis Date   Anxiety    Arthritis    COPD (chronic obstructive pulmonary disease) (HCC)    Coronary atherosclerosis of native coronary artery    PCTA obtuse marginal 5/99 - previous [facility] patient   COVID    Depression    Dyspnea    Fibromyalgia    Hearing loss    History of hiatal hernia    History of kidney stones     Hyperlipidemia    Hypertension    NSTEMI (non-ST elevated myocardial infarction) (HCC)    1999   Vision abnormalities     Past Surgical History:  Procedure Laterality Date   ABDOMINAL HYSTERECTOMY     BACK SURGERY     BREAST LUMPECTOMY Right    CHOLECYSTECTOMY     COLONOSCOPY     KNEE ARTHROSCOPY Left    TUBAL LIGATION      No current facility-administered medications for this encounter.   Current Outpatient Medications  Medication Sig Dispense Refill Last Dose/Taking   acetaminophen (TYLENOL) 500 MG tablet Take 1,000 mg by mouth in the morning, at noon, and at bedtime.   Taking   albuterol (VENTOLIN HFA) 108 (90 Base) MCG/ACT inhaler Inhale 2 puffs into the lungs every 6 (six) hours as needed for wheezing or shortness of breath.   Taking As Needed   atorvastatin (LIPITOR) 40 MG tablet Take 40 mg by mouth daily.   Taking   Budeson-Glycopyrrol-Formoterol (BREZTRI AEROSPHERE) 160-9-4.8 MCG/ACT AERO Inhale 2 puffs into the lungs daily.   Taking   calcium carbonate (OSCAL) 1500 (600 Ca) MG TABS tablet Take 600 mg of elemental calcium by mouth daily.   Taking   clopidogrel (PLAVIX) 75 MG tablet Take 75 mg by mouth daily.   Taking   Cranberry 500 MG CAPS Take 500 mg by mouth at bedtime.   Taking   DULoxetine (CYMBALTA) 60 MG capsule Take 60 mg by mouth 2 (two) times daily.   Taking   ergocalciferol (VITAMIN D2) 50000 UNITS capsule Take 50,000 Units by mouth  once a week.     Taking   fish oil-omega-3 fatty acids 1000 MG capsule Take 3 g by mouth daily.   Taking   gabapentin (NEURONTIN) 300 MG capsule Take 300 mg by mouth 3 (three) times daily.   Taking   lisinopril-hydrochlorothiazide (ZESTORETIC) 10-12.5 MG tablet Take 1 tablet by mouth daily.   Taking   loratadine (CLARITIN) 10 MG tablet Take 10 mg by mouth daily.   Taking   metoprolol succinate (TOPROL XL) 25 MG 24 hr tablet Take 1 tablet (25 mg total) by mouth daily. (Patient taking differently: Take 25 mg by mouth at bedtime.) 30  tablet 6 Taking Differently   Multiple Vitamins-Minerals (SENIOR MULTIVITAMIN PLUS PO) Take 1 tablet by mouth daily.   Taking   nitroGLYCERIN (NITROSTAT) 0.4 MG SL tablet Place 1 tablet (0.4 mg total) under the tongue every 5 (five) minutes as needed. Up to 3 doses. If no relief after 3rd dose, proceed to ED 25 tablet 3 Taking As Needed   polycarbophil (FIBERCON) 625 MG tablet Take 625 mg by mouth at bedtime.   Taking   Allergies  Allergen Reactions   Tape Other (See Comments)    Blisters    Social History   Tobacco Use   Smoking status: Former    Current packs/day: 0.00    Average packs/day: 0.5 packs/day for 37.1 years (18.5 ttl pk-yrs)    Types: Cigarettes    Start date: 05/26/2012    Quit date: 2022    Years since quitting: 3.1   Smokeless tobacco: Never  Substance Use Topics   Alcohol use: No    Alcohol/week: 0.0 standard drinks of alcohol    Family History  Problem Relation Age of Onset   COPD Father    Heart disease Father    Diabetes type II Mother      Review of Systems  Objective:  Physical Exam Vitals reviewed.  Constitutional:      Appearance: Normal appearance. She is obese.  HENT:     Head: Normocephalic and atraumatic.  Eyes:     Extraocular Movements: Extraocular movements intact.     Pupils: Pupils are equal, round, and reactive to light.  Cardiovascular:     Rate and Rhythm: Normal rate.  Pulmonary:     Effort: Pulmonary effort is normal.     Breath sounds: Normal breath sounds.  Abdominal:     Palpations: Abdomen is soft.  Musculoskeletal:     Cervical back: Normal range of motion and neck supple.     Right hip: Tenderness and bony tenderness present. Decreased range of motion. Decreased strength.  Neurological:     Mental Status: She is alert and oriented to person, place, and time.  Psychiatric:        Behavior: Behavior normal.     Vital signs in last 24 hours:    Labs:   Estimated body mass index is 32.69 kg/m as calculated  from the following:   Height as of 09/19/23: 5\' 8"  (1.727 m).   Weight as of 09/19/23: 97.5 kg.   Imaging Review Plain radiographs demonstrate severe degenerative joint disease of the right hip(s). The bone quality appears to be good for age and reported activity level.      Assessment/Plan:  End stage arthritis, right hip(s)  The patient history, physical examination, clinical judgement of the provider and imaging studies are consistent with end stage degenerative joint disease of the right hip(s) and total hip arthroplasty is deemed medically necessary. The  treatment options including medical management, injection therapy, arthroscopy and arthroplasty were discussed at length. The risks and benefits of total hip arthroplasty were presented and reviewed. The risks due to aseptic loosening, infection, stiffness, dislocation/subluxation,  thromboembolic complications and other imponderables were discussed.  The patient acknowledged the explanation, agreed to proceed with the plan and consent was signed. Patient is being admitted for inpatient treatment for surgery, pain control, PT, OT, prophylactic antibiotics, VTE prophylaxis, progressive ambulation and ADL's and discharge planning.The patient is planning to be discharged home with home health services

## 2023-09-24 NOTE — Telephone Encounter (Signed)
 Ortho bundle pre-op call completed.

## 2023-09-25 ENCOUNTER — Observation Stay (HOSPITAL_COMMUNITY)

## 2023-09-25 ENCOUNTER — Other Ambulatory Visit: Payer: Self-pay

## 2023-09-25 ENCOUNTER — Ambulatory Visit (HOSPITAL_COMMUNITY)

## 2023-09-25 ENCOUNTER — Ambulatory Visit (HOSPITAL_COMMUNITY): Payer: Self-pay | Admitting: Vascular Surgery

## 2023-09-25 ENCOUNTER — Encounter (HOSPITAL_COMMUNITY)
Admission: RE | Disposition: A | Discharge status: Home / Self Care | Payer: Self-pay | Source: Home / Self Care | Attending: Orthopaedic Surgery

## 2023-09-25 ENCOUNTER — Encounter (HOSPITAL_COMMUNITY): Payer: Self-pay | Admitting: Orthopaedic Surgery

## 2023-09-25 ENCOUNTER — Observation Stay (HOSPITAL_COMMUNITY)
Admission: RE | Admit: 2023-09-25 | Discharge: 2023-09-26 | Disposition: A | Discharge status: Home / Self Care | Payer: Medicare Other | Attending: Orthopaedic Surgery | Admitting: Orthopaedic Surgery

## 2023-09-25 DIAGNOSIS — Z96641 Presence of right artificial hip joint: Secondary | ICD-10-CM | POA: Diagnosis not present

## 2023-09-25 DIAGNOSIS — Z79899 Other long term (current) drug therapy: Secondary | ICD-10-CM | POA: Insufficient documentation

## 2023-09-25 DIAGNOSIS — Z7902 Long term (current) use of antithrombotics/antiplatelets: Secondary | ICD-10-CM | POA: Diagnosis not present

## 2023-09-25 DIAGNOSIS — Z8616 Personal history of COVID-19: Secondary | ICD-10-CM | POA: Diagnosis not present

## 2023-09-25 DIAGNOSIS — J449 Chronic obstructive pulmonary disease, unspecified: Secondary | ICD-10-CM | POA: Diagnosis not present

## 2023-09-25 DIAGNOSIS — M1611 Unilateral primary osteoarthritis, right hip: Principal | ICD-10-CM | POA: Diagnosis present

## 2023-09-25 DIAGNOSIS — Z471 Aftercare following joint replacement surgery: Secondary | ICD-10-CM | POA: Diagnosis not present

## 2023-09-25 DIAGNOSIS — I2510 Atherosclerotic heart disease of native coronary artery without angina pectoris: Secondary | ICD-10-CM | POA: Diagnosis not present

## 2023-09-25 DIAGNOSIS — I10 Essential (primary) hypertension: Secondary | ICD-10-CM | POA: Diagnosis not present

## 2023-09-25 DIAGNOSIS — Z87891 Personal history of nicotine dependence: Secondary | ICD-10-CM | POA: Diagnosis not present

## 2023-09-25 HISTORY — PX: TOTAL HIP ARTHROPLASTY: SHX124

## 2023-09-25 LAB — ABO/RH: ABO/RH(D): A POS

## 2023-09-25 SURGERY — ARTHROPLASTY, HIP, TOTAL, ANTERIOR APPROACH
Anesthesia: Spinal | Site: Hip | Laterality: Right

## 2023-09-25 MED ORDER — ATORVASTATIN CALCIUM 40 MG PO TABS
40.0000 mg | ORAL_TABLET | Freq: Every day | ORAL | Status: DC
  Administered 2023-09-26: 40 mg via ORAL
  Filled 2023-09-25: qty 1

## 2023-09-25 MED ORDER — CEFAZOLIN SODIUM-DEXTROSE 2-4 GM/100ML-% IV SOLN
INTRAVENOUS | Status: AC
  Filled 2023-09-25: qty 100

## 2023-09-25 MED ORDER — SODIUM CHLORIDE 0.9 % IV SOLN: INTRAVENOUS | Status: DC

## 2023-09-25 MED ORDER — PHENYLEPHRINE 80 MCG/ML (10ML) SYRINGE FOR IV PUSH (FOR BLOOD PRESSURE SUPPORT)
PREFILLED_SYRINGE | INTRAVENOUS | Status: DC | PRN
  Administered 2023-09-25: 80 ug via INTRAVENOUS
  Administered 2023-09-25: 160 ug via INTRAVENOUS
  Administered 2023-09-25 (×2): 80 ug via INTRAVENOUS
  Administered 2023-09-25: 200 ug via INTRAVENOUS
  Administered 2023-09-25: 80 ug via INTRAVENOUS

## 2023-09-25 MED ORDER — MENTHOL 3 MG MT LOZG: 1.0000 | LOZENGE | OROMUCOSAL | Status: DC | PRN

## 2023-09-25 MED ORDER — ONDANSETRON HCL 4 MG/2ML IJ SOLN: 4.0000 mg | Freq: Once | INTRAMUSCULAR | Status: DC | PRN

## 2023-09-25 MED ORDER — ONDANSETRON HCL 4 MG/2ML IJ SOLN: 4.0000 mg | Freq: Four times a day (QID) | INTRAMUSCULAR | Status: DC | PRN

## 2023-09-25 MED ORDER — 0.9 % SODIUM CHLORIDE (POUR BTL) OPTIME
TOPICAL | Status: DC | PRN
  Administered 2023-09-25: 1000 mL

## 2023-09-25 MED ORDER — PHENOL 1.4 % MT LIQD: 1.0000 | OROMUCOSAL | Status: DC | PRN

## 2023-09-25 MED ORDER — ASPIRIN 81 MG PO CHEW
81.0000 mg | CHEWABLE_TABLET | Freq: Every day | ORAL | Status: DC
  Administered 2023-09-26: 81 mg via ORAL
  Filled 2023-09-25: qty 1

## 2023-09-25 MED ORDER — PROPOFOL 500 MG/50ML IV EMUL
INTRAVENOUS | Status: DC | PRN
  Administered 2023-09-25: 100 ug/kg/min via INTRAVENOUS

## 2023-09-25 MED ORDER — ORAL CARE MOUTH RINSE: 15.0000 mL | Freq: Once | OROMUCOSAL | Status: AC

## 2023-09-25 MED ORDER — METHOCARBAMOL 500 MG PO TABS
500.0000 mg | ORAL_TABLET | Freq: Four times a day (QID) | ORAL | Status: DC | PRN
  Administered 2023-09-25 – 2023-09-26 (×2): 500 mg via ORAL
  Filled 2023-09-25 (×2): qty 1

## 2023-09-25 MED ORDER — ONDANSETRON HCL 4 MG PO TABS: 4.0000 mg | ORAL_TABLET | Freq: Four times a day (QID) | ORAL | Status: DC | PRN

## 2023-09-25 MED ORDER — GABAPENTIN 300 MG PO CAPS
300.0000 mg | ORAL_CAPSULE | Freq: Three times a day (TID) | ORAL | Status: DC
  Administered 2023-09-25 – 2023-09-26 (×3): 300 mg via ORAL
  Filled 2023-09-25 (×2): qty 1
  Filled 2023-09-25: qty 3

## 2023-09-25 MED ORDER — LORATADINE 10 MG PO TABS
10.0000 mg | ORAL_TABLET | Freq: Every day | ORAL | Status: DC
  Administered 2023-09-26: 10 mg via ORAL
  Filled 2023-09-25: qty 1

## 2023-09-25 MED ORDER — PANTOPRAZOLE SODIUM 40 MG PO TBEC
40.0000 mg | DELAYED_RELEASE_TABLET | Freq: Every day | ORAL | Status: DC
  Administered 2023-09-25 – 2023-09-26 (×2): 40 mg via ORAL
  Filled 2023-09-25 (×2): qty 1

## 2023-09-25 MED ORDER — DIPHENHYDRAMINE HCL 12.5 MG/5ML PO ELIX: 12.5000 mg | ORAL_SOLUTION | ORAL | Status: DC | PRN

## 2023-09-25 MED ORDER — ACETAMINOPHEN 325 MG PO TABS: 325.0000 mg | ORAL_TABLET | Freq: Four times a day (QID) | ORAL | Status: DC | PRN

## 2023-09-25 MED ORDER — METHOCARBAMOL 1000 MG/10ML IJ SOLN: 500.0000 mg | Freq: Four times a day (QID) | INTRAMUSCULAR | Status: DC | PRN

## 2023-09-25 MED ORDER — EPHEDRINE SULFATE-NACL 50-0.9 MG/10ML-% IV SOSY
PREFILLED_SYRINGE | INTRAVENOUS | Status: DC | PRN
  Administered 2023-09-25: 10 mg via INTRAVENOUS
  Administered 2023-09-25: 5 mg via INTRAVENOUS
  Administered 2023-09-25: 10 mg via INTRAVENOUS

## 2023-09-25 MED ORDER — POVIDONE-IODINE 10 % EX SWAB
2.0000 | Freq: Once | CUTANEOUS | Status: AC
  Administered 2023-09-25: 2 via TOPICAL

## 2023-09-25 MED ORDER — LACTATED RINGERS IV SOLN: INTRAVENOUS | Status: DC

## 2023-09-25 MED ORDER — CLOPIDOGREL BISULFATE 75 MG PO TABS
75.0000 mg | ORAL_TABLET | Freq: Every day | ORAL | Status: DC
  Administered 2023-09-26: 75 mg via ORAL
  Filled 2023-09-25: qty 1

## 2023-09-25 MED ORDER — SODIUM CHLORIDE 0.9 % IR SOLN
Status: DC | PRN
  Administered 2023-09-25: 1000 mL

## 2023-09-25 MED ORDER — DULOXETINE HCL 60 MG PO CPEP
60.0000 mg | ORAL_CAPSULE | Freq: Two times a day (BID) | ORAL | Status: DC
  Administered 2023-09-25 – 2023-09-26 (×2): 60 mg via ORAL
  Filled 2023-09-25 (×2): qty 1

## 2023-09-25 MED ORDER — TRANEXAMIC ACID-NACL 1000-0.7 MG/100ML-% IV SOLN
1000.0000 mg | INTRAVENOUS | Status: AC
  Administered 2023-09-25: 1000 mg via INTRAVENOUS

## 2023-09-25 MED ORDER — ALUM & MAG HYDROXIDE-SIMETH 200-200-20 MG/5ML PO SUSP: 30.0000 mL | ORAL | Status: DC | PRN

## 2023-09-25 MED ORDER — ALBUTEROL SULFATE (2.5 MG/3ML) 0.083% IN NEBU: 3.0000 mL | INHALATION_SOLUTION | Freq: Four times a day (QID) | RESPIRATORY_TRACT | Status: DC | PRN

## 2023-09-25 MED ORDER — UMECLIDINIUM BROMIDE 62.5 MCG/ACT IN AEPB
1.0000 | INHALATION_SPRAY | Freq: Every day | RESPIRATORY_TRACT | Status: DC
  Administered 2023-09-26: 1 via RESPIRATORY_TRACT
  Filled 2023-09-25: qty 7

## 2023-09-25 MED ORDER — METOCLOPRAMIDE HCL 5 MG/ML IJ SOLN: 5.0000 mg | Freq: Three times a day (TID) | INTRAMUSCULAR | Status: DC | PRN

## 2023-09-25 MED ORDER — CEFAZOLIN SODIUM-DEXTROSE 2-4 GM/100ML-% IV SOLN
2.0000 g | INTRAVENOUS | Status: AC
  Administered 2023-09-25: 2 g via INTRAVENOUS

## 2023-09-25 MED ORDER — DOCUSATE SODIUM 100 MG PO CAPS
100.0000 mg | ORAL_CAPSULE | Freq: Two times a day (BID) | ORAL | Status: DC
  Administered 2023-09-25 – 2023-09-26 (×3): 100 mg via ORAL
  Filled 2023-09-25 (×3): qty 1

## 2023-09-25 MED ORDER — PROPOFOL 10 MG/ML IV BOLUS
INTRAVENOUS | Status: DC | PRN
  Administered 2023-09-25: 20 mg via INTRAVENOUS
  Administered 2023-09-25: 50 mg via INTRAVENOUS

## 2023-09-25 MED ORDER — METOPROLOL SUCCINATE ER 25 MG PO TB24
25.0000 mg | ORAL_TABLET | Freq: Every day | ORAL | Status: DC
  Administered 2023-09-25: 25 mg via ORAL
  Filled 2023-09-25: qty 1

## 2023-09-25 MED ORDER — HYDROMORPHONE HCL 1 MG/ML IJ SOLN
0.5000 mg | INTRAMUSCULAR | Status: DC | PRN
  Administered 2023-09-25: 1 mg via INTRAVENOUS
  Filled 2023-09-25: qty 1

## 2023-09-25 MED ORDER — PHENYLEPHRINE HCL-NACL 20-0.9 MG/250ML-% IV SOLN: INTRAVENOUS | Status: DC | PRN

## 2023-09-25 MED ORDER — OXYCODONE HCL 5 MG PO TABS: 5.0000 mg | ORAL_TABLET | ORAL | Status: DC | PRN

## 2023-09-25 MED ORDER — HYDROMORPHONE HCL 1 MG/ML IJ SOLN
INTRAMUSCULAR | Status: AC
  Filled 2023-09-25: qty 1

## 2023-09-25 MED ORDER — CHLORHEXIDINE GLUCONATE 0.12 % MT SOLN
OROMUCOSAL | Status: AC
  Administered 2023-09-25: 15 mL via OROMUCOSAL
  Filled 2023-09-25: qty 15

## 2023-09-25 MED ORDER — OXYCODONE HCL 5 MG/5ML PO SOLN: 5.0000 mg | Freq: Once | ORAL | Status: DC | PRN

## 2023-09-25 MED ORDER — CHLORHEXIDINE GLUCONATE 0.12 % MT SOLN: 15.0000 mL | Freq: Once | OROMUCOSAL | Status: AC

## 2023-09-25 MED ORDER — METOCLOPRAMIDE HCL 5 MG PO TABS: 5.0000 mg | ORAL_TABLET | Freq: Three times a day (TID) | ORAL | Status: DC | PRN

## 2023-09-25 MED ORDER — TRANEXAMIC ACID-NACL 1000-0.7 MG/100ML-% IV SOLN
INTRAVENOUS | Status: AC
  Filled 2023-09-25: qty 100

## 2023-09-25 MED ORDER — BUDESON-GLYCOPYRROL-FORMOTEROL 160-9-4.8 MCG/ACT IN AERO
2.0000 | INHALATION_SPRAY | Freq: Every day | RESPIRATORY_TRACT | Status: DC
Start: 2023-09-25 — End: 2023-09-25

## 2023-09-25 MED ORDER — OXYCODONE HCL 5 MG PO TABS
10.0000 mg | ORAL_TABLET | ORAL | Status: DC | PRN
  Administered 2023-09-25 – 2023-09-26 (×2): 15 mg via ORAL
  Filled 2023-09-25 (×2): qty 3

## 2023-09-25 MED ORDER — HYDROMORPHONE HCL 1 MG/ML IJ SOLN
0.2500 mg | INTRAMUSCULAR | Status: DC | PRN
  Administered 2023-09-25 (×2): 0.5 mg via INTRAVENOUS

## 2023-09-25 MED ORDER — CEFAZOLIN SODIUM-DEXTROSE 2-4 GM/100ML-% IV SOLN
2.0000 g | Freq: Four times a day (QID) | INTRAVENOUS | Status: AC
  Administered 2023-09-25 (×2): 2 g via INTRAVENOUS
  Filled 2023-09-25 (×2): qty 100

## 2023-09-25 MED ORDER — FLUTICASONE FUROATE-VILANTEROL 100-25 MCG/ACT IN AEPB
1.0000 | INHALATION_SPRAY | Freq: Every day | RESPIRATORY_TRACT | Status: DC
Start: 2023-09-26 — End: 2023-09-26
  Administered 2023-09-26: 1 via RESPIRATORY_TRACT

## 2023-09-25 MED ORDER — BUPIVACAINE IN DEXTROSE 0.75-8.25 % IT SOLN
INTRATHECAL | Status: DC | PRN
Start: 2023-09-25 — End: 2023-09-25
  Administered 2023-09-25: 1.8 mL via INTRATHECAL

## 2023-09-25 MED ORDER — OXYCODONE HCL 5 MG PO TABS: 5.0000 mg | ORAL_TABLET | Freq: Once | ORAL | Status: DC | PRN

## 2023-09-25 SURGICAL SUPPLY — 48 items
ARTICULEZE HEAD (Hips) ×1 IMPLANT
BAG COUNTER SPONGE SURGICOUNT (BAG) ×1 IMPLANT
BENZOIN TINCTURE PRP APPL 2/3 (GAUZE/BANDAGES/DRESSINGS) ×1 IMPLANT
BLADE CLIPPER SURG (BLADE) IMPLANT
BLADE SAW SGTL 18X1.27X75 (BLADE) ×1 IMPLANT
COVER SURGICAL LIGHT HANDLE (MISCELLANEOUS) ×1 IMPLANT
CUP ACET PNNCL SECTR W/GRIP 56 (Hips) IMPLANT
DRAPE C-ARM 42X72 X-RAY (DRAPES) ×1 IMPLANT
DRAPE STERI IOBAN 125X83 (DRAPES) ×1 IMPLANT
DRAPE U-SHAPE 47X51 STRL (DRAPES) ×3 IMPLANT
DRSG AQUACEL AG ADV 3.5X10 (GAUZE/BANDAGES/DRESSINGS) ×1 IMPLANT
DURAPREP 26ML APPLICATOR (WOUND CARE) ×1 IMPLANT
ELECT BLADE 4.0 EZ CLEAN MEGAD (MISCELLANEOUS) ×1 IMPLANT
ELECT BLADE 6.5 EXT (BLADE) IMPLANT
ELECT REM PT RETURN 9FT ADLT (ELECTROSURGICAL) ×1 IMPLANT
ELECTRODE BLDE 4.0 EZ CLN MEGD (MISCELLANEOUS) ×1 IMPLANT
ELECTRODE REM PT RTRN 9FT ADLT (ELECTROSURGICAL) ×1 IMPLANT
FACESHIELD WRAPAROUND (MASK) ×2 IMPLANT
FACESHIELD WRAPAROUND OR TEAM (MASK) ×2 IMPLANT
FEM STEM 12/14 TAPER SZ 4 HIP (Orthopedic Implant) ×1 IMPLANT
FEMORAL STEM 12/14 TPR SZ4 HIP (Orthopedic Implant) IMPLANT
GLOVE BIOGEL PI IND STRL 8 (GLOVE) ×2 IMPLANT
GLOVE ECLIPSE 8.0 STRL XLNG CF (GLOVE) ×1 IMPLANT
GLOVE ORTHO TXT STRL SZ7.5 (GLOVE) ×2 IMPLANT
GOWN STRL REUS W/ TWL LRG LVL3 (GOWN DISPOSABLE) ×2 IMPLANT
GOWN STRL REUS W/ TWL XL LVL3 (GOWN DISPOSABLE) ×2 IMPLANT
HEAD ARTICULEZE (Hips) IMPLANT
KIT BASIN OR (CUSTOM PROCEDURE TRAY) ×1 IMPLANT
KIT TURNOVER KIT B (KITS) ×1 IMPLANT
MANIFOLD NEPTUNE II (INSTRUMENTS) ×1 IMPLANT
NS IRRIG 1000ML POUR BTL (IV SOLUTION) ×1 IMPLANT
PACK TOTAL JOINT (CUSTOM PROCEDURE TRAY) ×1 IMPLANT
PAD ARMBOARD 7.5X6 YLW CONV (MISCELLANEOUS) ×1 IMPLANT
PINN SECTOR W/GRIP ACE CUP 56 (Hips) ×1 IMPLANT
PINNACLE ALTRX PLUS 4 N 36X56 (Hips) IMPLANT
SET HNDPC FAN SPRY TIP SCT (DISPOSABLE) ×1 IMPLANT
STAPLER VISISTAT 35W (STAPLE) IMPLANT
STRIP CLOSURE SKIN 1/2X4 (GAUZE/BANDAGES/DRESSINGS) ×2 IMPLANT
SUT ETHIBOND NAB CT1 #1 30IN (SUTURE) ×1 IMPLANT
SUT MNCRL AB 4-0 PS2 18 (SUTURE) IMPLANT
SUT VIC AB 0 CT1 27XBRD ANBCTR (SUTURE) ×1 IMPLANT
SUT VIC AB 1 CT1 27XBRD ANBCTR (SUTURE) ×1 IMPLANT
SUT VIC AB 2-0 CT1 TAPERPNT 27 (SUTURE) ×1 IMPLANT
TOWEL GREEN STERILE (TOWEL DISPOSABLE) ×1 IMPLANT
TOWEL GREEN STERILE FF (TOWEL DISPOSABLE) ×1 IMPLANT
TRAY CATH INTERMITTENT SS 16FR (CATHETERS) IMPLANT
TRAY FOLEY W/BAG SLVR 16FR ST (SET/KITS/TRAYS/PACK) IMPLANT
WATER STERILE IRR 1000ML POUR (IV SOLUTION) ×2 IMPLANT

## 2023-09-25 NOTE — Interval H&P Note (Signed)
 History and Physical Interval Note: The patient understands that she is here today for a right total hip replacement to treat her significant right hip pain and arthritis.  There has been no acute or interval change in her medical status.  The risks and benefits of surgery have been discussed in detail and informed consent has been obtained.  The right operative hip has been marked.  09/25/2023 8:50 AM  Audrey Craig  has presented today for surgery, with the diagnosis of osteoarthritis right hip.  The various methods of treatment have been discussed with the patient and family. After consideration of risks, benefits and other options for treatment, the patient has consented to  Procedure(s): RIGHT TOTAL HIP ARTHROPLASTY ANTERIOR APPROACH (Right) as a surgical intervention.  The patient's history has been reviewed, patient examined, no change in status, stable for surgery.  I have reviewed the patient's chart and labs.  Questions were answered to the patient's satisfaction.     Kathryne Hitch

## 2023-09-25 NOTE — Progress Notes (Signed)
 Per pt she was told to hold Metoprolol since last Tuesday. Dr. Malen Gauze is aware. No new orders.

## 2023-09-25 NOTE — Anesthesia Postprocedure Evaluation (Signed)
 Anesthesia Post Note  Patient: Audrey Craig  Procedure(s) Performed: RIGHT TOTAL HIP ARTHROPLASTY ANTERIOR APPROACH (Right: Hip)     Patient location during evaluation: PACU Anesthesia Type: Spinal Level of consciousness: oriented and awake and alert Pain management: pain level controlled Vital Signs Assessment: post-procedure vital signs reviewed and stable Respiratory status: spontaneous breathing, respiratory function stable and patient connected to nasal cannula oxygen Cardiovascular status: blood pressure returned to baseline and stable Postop Assessment: no headache, no backache, no apparent nausea or vomiting and spinal receding Anesthetic complications: no   No notable events documented.  Last Vitals:  Vitals:   09/25/23 1315 09/25/23 1330  BP: 137/64 116/63  Pulse: 63 67  Resp: 11 15  Temp:    SpO2: 96% 97%    Last Pain:  Vitals:   09/25/23 1250  TempSrc:   PainSc: 0-No pain                 Rosio Weiss A.

## 2023-09-25 NOTE — Op Note (Signed)
 Operative Note  Date of operation: 09/25/2023 Preoperative diagnosis: Right hip primary osteoarthritis Postoperative diagnosis: Same  Procedure: Right direct anterior total hip arthroplasty  Implants: Implant Name Type Inv. Item Serial No. Manufacturer Lot No. LRB No. Used Action  PINN SECTOR W/GRIP ACE CUP 56 - [id number] Hips PINN SECTOR W/GRIP ACE CUP 56  [employer] [id number] Right 1 Implanted  PINNACLE ALTRX PLUS 4 N 36X56 - [id number] Hips PINNACLE ALTRX PLUS 4 N 36X56  [employer] [id number] Right 1 Implanted  FEM STEM 12/14 TAPER SZ 4 HIP - [id number] Orthopedic Implant FEM STEM 12/14 TAPER SZ 4 HIP  [employer] [id number] Right 1 Implanted  ARTICULEZE HEAD - [id number] Hips ARTICULEZE HEAD  [employer] [id number] Right 1 Implanted   Surgeon: Vanita Panda. Magnus Ivan, MD Assistant: Rexene Edison, PA-C  Anesthesia: Spinal Antibiotics: IV Ancef EBL: 50 cc Complications: None  Indications: The patient is an active 73 year old female with end-stage debilitating arthritis involving her right hip.  Her x-rays show complete loss of joint space with bone-on-bone wear.  Her right hip pain is daily and it is detrimentally affecting her mobility, her quality of life and her actives daily living.  She wished to proceed with a hip replacement and we agree with this as well.  We did discuss the risks of acute blood loss anemia, nerve and vessel injury, fracture, infection, DVT, dislocation, implant failure, leg length differences and wound healing issues.  She understands that our goals are hopefully decreased pain, improved mobility, and improved quality of life.  Procedure description: After informed consent was obtained and the appropriate right hip was marked, the patient was brought to the operating room and set up on the stretcher where spinal anesthesia was obtained.  She was then laid in supine position on stretcher and a Foley catheter was placed.  Traction boots were  placed on both her feet and next she was placed supine on the Hana fracture table with a perineal post and placed in both legs and inline skeletal traction devices but no traction applied.  Her right operative hip and pelvis were assessed radiographically.  The right hip was prepped and draped with DuraPrep and sterile drapes.  A timeout was called and she was identified as the correct patient the correct right hip.  An incision was then made just inferior and posterior to the ASIS and carried slightly obliquely down the leg.  Dissection was carried down to the tensor fascia lata muscle and the tensor fascia was then divided longitudinally to proceed with a direct tender approach to the hip.  Circumflex vessels were identified and cauterized.  The hip capsule was identified and opened up in L-type format finding a moderate joint effusion.  Cobra retractors were placed around the medial and lateral femoral neck and a femoral neck cut was made with an oscillating saw just proximal to the lesser trochanter.  This cut was completed with an osteotome.  A corkscrew guide was placed in the femoral head and the femoral head was removed its entirety and there was a wide area devoid of cartilage.  A bent Hohmann was then placed over the medial sterile rim and remnants of the acetabular labrum and other debris removed.  Reaming was then initiated from a size 43 reamer and stepwise increments going up to a size 55 reamer with all reamers placed under direct visualization and the last reamer also placed under direct fluoroscopy in order to obtain the depth reaming, the inclination and the anteversion.  The  real DePuy sector GRIPTION acetabular component size 56 was then placed without difficulty followed by a 36+4 polyethylene liner for that size 56 acetabular component.  Attention was then turned to the femur.  With the right leg externally rotated to 120 degrees, extended and adducted, a Mueller retractors placed medially and  a Hohmann directed by the greater trochanter.  The lateral joint capsule was released and a box cutting osteotome was used in her femoral canal.  Broaching was then initiated using the Actis broaching system from a size 0 going to a size 4.  With a size 4 in place we trialed a high offset femoral neck and a 36+1.5 trial hip ball.  We brought the leg back over and up and with traction and internal rotation reduced in the pelvis.  Based on radiographic assessment we felt like we needed more leg length.  Although that side looks longer, clinically no she is shorter.  We dislocated the hip easily and removed all trial components.  We then placed the real Actis femoral component from DePuy size 4 with high offset and the real 36+5 metal hip ball.  Again this was reduced in the pelvis were pleased with leg length, offset, range of motion and stability assessed mechanically and radiographically.  The soft tissue was then irrigated normal saline solution.  Remnants of the joint capsule was closed with interrupted #1 Ethibond suture.  #1 Vicryl discussed the tensor fascia.  0 Vicryl was used to close the deep tissue and 2-0 Vicryl was used to close subcutaneous tissue.  The skin was closed with staples.  An Aquacel dressing was applied.  The patient was taken off of the Hana table and taken the recovery room.  Rexene Edison, PA-C did assist during the entire case and beginning to end and his assistance was crucial and medically necessary for soft tissue management and retraction, helping guide implant placement and a layered closure of the wound.

## 2023-09-25 NOTE — Plan of Care (Signed)

## 2023-09-25 NOTE — Transfer of Care (Signed)
 Immediate Anesthesia Transfer of Care Note  Patient: Audrey Craig  Procedure(s) Performed: RIGHT TOTAL HIP ARTHROPLASTY ANTERIOR APPROACH (Right: Hip)  Patient Location: PACU  Anesthesia Type:Spinal  Level of Consciousness: awake, alert , and oriented  Airway & Oxygen Therapy: Patient Spontanous Breathing and Patient connected to nasal cannula oxygen  Post-op Assessment: Report given to RN and Post -op Vital signs reviewed and stable  Post vital signs: Reviewed and stable  Last Vitals:  Vitals Value Taken Time  BP 104/53 09/25/23 1220  Temp 36.6 C 09/25/23 1220  Pulse 79 09/25/23 1224  Resp 12 09/25/23 1224  SpO2 99 % 09/25/23 1224  Vitals shown include unfiled device data.  Last Pain:  Vitals:   09/25/23 0825  TempSrc: Oral         Complications: No notable events documented.

## 2023-09-25 NOTE — Anesthesia Procedure Notes (Signed)
 Spinal  Patient location during procedure: OR Start time: 09/25/2023 11:00 AM End time: 09/25/2023 11:02 AM Reason for block: surgical anesthesia Staffing Performed: anesthesiologist  Anesthesiologist: Mal Amabile, MD Performed by: Mal Amabile, MD Authorized by: Mal Amabile, MD   Preanesthetic Checklist Completed: patient identified, IV checked, site marked, risks and benefits discussed, surgical consent, monitors and equipment checked, pre-op evaluation and timeout performed Spinal Block Patient position: sitting Prep: DuraPrep and site prepped and draped Patient monitoring: heart rate, cardiac monitor, continuous pulse ox and blood pressure Approach: midline Location: L3-4 Injection technique: single-shot Needle Needle type: Pencan  Needle gauge: 24 G Needle length: 9 cm Needle insertion depth: 7 cm Assessment Sensory level: T4 Events: CSF return Additional Notes Patient tolerated procedure well. Adequate sensory level.

## 2023-09-25 NOTE — Evaluation (Signed)
 Physical Therapy Evaluation Patient Details Name: Audrey Craig MRN: [id number] DOB: 1950/11/14 Today's Date: 09/25/2023  History of Present Illness  73 y.o. female presents to [facility] hospital on 09/25/2023 for elective R THA. PMH includes HLD, CAD, ataxic gait with abnormal brain MRI.  Clinical Impression  Pt presents to PT with deficits in functional mobility, gait, balance, power, endurance, strength, communication. Pt has a baseline hearing impairment and must read lips often during evaluation to communicate effectively. Pt is able to stand and ambulate for short household distances with a RW. PT provides surgical hip HEP to improve strength and balance. Pt will benefit from further gait and stair training tomorrow.         If plan is discharge home, recommend the following: A little help with walking and/or transfers;A lot of help with bathing/dressing/bathroom;Assistance with cooking/housework;Assist for transportation;Help with stairs or ramp for entrance   Can travel by private vehicle        Equipment Recommendations BSC/3in1  Recommendations for Other Services       Functional Status Assessment Patient has had a recent decline in their functional status and demonstrates the ability to make significant improvements in function in a reasonable and predictable amount of time.     Precautions / Restrictions Precautions Precautions: Fall Recall of Precautions/Restrictions: Intact Precaution/Restrictions Comments: direct anterior THA Restrictions Weight Bearing Restrictions Per Provider Order: Yes RLE Weight Bearing Per Provider Order: Weight bearing as tolerated      Mobility  Bed Mobility Overal bed mobility: Needs Assistance Bed Mobility: Rolling, Sidelying to Sit, Sit to Sidelying Rolling: Contact guard assist Sidelying to sit: Contact guard assist     Sit to sidelying: Contact guard assist      Transfers Overall transfer level: Needs assistance Equipment used:  Rolling walker (2 wheels) Transfers: Sit to/from Stand Sit to Stand: Contact guard assist                Ambulation/Gait Ambulation/Gait assistance: Contact guard assist Gait Distance (Feet): 40 Feet Assistive device: Rolling walker (2 wheels) Gait Pattern/deviations: Step-to pattern Gait velocity: reduced Gait velocity interpretation: <1.31 ft/sec, indicative of household ambulator   General Gait Details: slowed step-to gait, reduced stance time on RLE  Stairs            Wheelchair Mobility     Tilt Bed    Modified Rankin (Stroke Patients Only)       Balance Overall balance assessment: Needs assistance Sitting-balance support: No upper extremity supported, Feet supported Sitting balance-Leahy Scale: Good     Standing balance support: Bilateral upper extremity supported, Reliant on assistive device for balance Standing balance-Leahy Scale: Poor                               Pertinent Vitals/Pain Pain Assessment Pain Assessment: Faces Faces Pain Scale: Hurts even more Pain Location: R hip Pain Descriptors / Indicators: Burning Pain Intervention(s): Monitored during session    Home Living Family/patient expects to be discharged to:: Private residence Living Arrangements: Other relatives (cousin lives with her and is disabled, also is raising a 71 y.o. Pt has the option to discharge to another family members home with 24/7 support) Available Help at Discharge: Family;Available 24 hours/day Type of Home: House Home Access: Stairs to enter Entrance Stairs-Rails: Can reach both Entrance Stairs-Number of Steps: 4   Home Layout: One level Home Equipment: Agricultural consultant (2 wheels);Cane - single point;Shower seat  Prior Function Prior Level of Function : Independent/Modified Independent;Driving             Mobility Comments: ambulatory with RW       Extremity/Trunk Assessment   Upper Extremity Assessment Upper Extremity  Assessment: Overall WFL for tasks assessed    Lower Extremity Assessment Lower Extremity Assessment: RLE deficits/detail RLE Deficits / Details: generalized post-op weakness as expected on POD 0 s/p THA    Cervical / Trunk Assessment Cervical / Trunk Assessment: Kyphotic  Communication   Communication Communication: Impaired Factors Affecting Communication: Hearing impaired (pt has hearing aides but does not wear them. Must read lips to hear PT during this eval)    Cognition Arousal: Alert Behavior During Therapy: WFL for tasks assessed/performed   PT - Cognitive impairments: No apparent impairments                       PT - Cognition Comments: pt is slow to respond at times but this often seems related to hearing impairment Following commands: Intact       Cueing Cueing Techniques: Verbal cues, Gestural cues, Visual cues, Tactile cues     General Comments General comments (skin integrity, edema, etc.): VSS on RA    Exercises Other Exercises Other Exercises: PT provides education on surgical hip exercise packet   Assessment/Plan    PT Assessment Patient needs continued PT services  PT Problem List Decreased strength;Decreased activity tolerance;Decreased balance;Decreased mobility;Decreased knowledge of use of DME;Decreased safety awareness;Decreased knowledge of precautions;Pain       PT Treatment Interventions Gait training;DME instruction;Functional mobility training;Therapeutic activities;Therapeutic exercise;Balance training;Neuromuscular re-education;Cognitive remediation;Patient/family education;Stair training    PT Goals (Current goals can be found in the Care Plan section)  Acute Rehab PT Goals Patient Stated Goal: to return to independence, reduce pain PT Goal Formulation: With patient/family Time For Goal Achievement: 09/29/23 Potential to Achieve Goals: Good    Frequency 7X/week     Co-evaluation               AM-PAC PT "6 Clicks"  Mobility  Outcome Measure Help needed turning from your back to your side while in a flat bed without using bedrails?: A Little Help needed moving from lying on your back to sitting on the side of a flat bed without using bedrails?: A Little Help needed moving to and from a bed to a chair (including a wheelchair)?: A Little Help needed standing up from a chair using your arms (e.g., wheelchair or bedside chair)?: A Little Help needed to walk in hospital room?: A Little Help needed climbing 3-5 steps with a railing? : Total 6 Click Score: 16    End of Session Equipment Utilized During Treatment: Gait belt Activity Tolerance: Patient tolerated treatment well Patient left: in bed;with call bell/phone within reach (bed alarm is not functioning) Nurse Communication: Mobility status PT Visit Diagnosis: Other abnormalities of gait and mobility (R26.89);Muscle weakness (generalized) (M62.81);Pain Pain - Right/Left: Right Pain - part of body: Hip    Time: 1610-9604 PT Time Calculation (min) (ACUTE ONLY): 30 min   Charges:   PT Evaluation $PT Eval Low Complexity: 1 Low   PT General Charges $$ ACUTE PT VISIT: 1 Visit         Arlyss Gandy, PT, DPT Acute Rehabilitation Office [phone]   Arlyss Gandy 09/25/2023, 4:56 PM

## 2023-09-26 ENCOUNTER — Encounter (HOSPITAL_COMMUNITY): Payer: Self-pay | Admitting: Orthopaedic Surgery

## 2023-09-26 DIAGNOSIS — J449 Chronic obstructive pulmonary disease, unspecified: Secondary | ICD-10-CM | POA: Diagnosis not present

## 2023-09-26 DIAGNOSIS — I10 Essential (primary) hypertension: Secondary | ICD-10-CM | POA: Diagnosis not present

## 2023-09-26 DIAGNOSIS — Z87891 Personal history of nicotine dependence: Secondary | ICD-10-CM | POA: Diagnosis not present

## 2023-09-26 DIAGNOSIS — Z79899 Other long term (current) drug therapy: Secondary | ICD-10-CM | POA: Diagnosis not present

## 2023-09-26 DIAGNOSIS — I2510 Atherosclerotic heart disease of native coronary artery without angina pectoris: Secondary | ICD-10-CM | POA: Diagnosis not present

## 2023-09-26 DIAGNOSIS — Z8616 Personal history of COVID-19: Secondary | ICD-10-CM | POA: Diagnosis not present

## 2023-09-26 DIAGNOSIS — M1611 Unilateral primary osteoarthritis, right hip: Secondary | ICD-10-CM | POA: Diagnosis not present

## 2023-09-26 DIAGNOSIS — Z7902 Long term (current) use of antithrombotics/antiplatelets: Secondary | ICD-10-CM | POA: Diagnosis not present

## 2023-09-26 LAB — BASIC METABOLIC PANEL
Anion gap: 9 (ref 5–15)
BUN: 10 mg/dL (ref 8–23)
CO2: 26 mmol/L (ref 22–32)
Calcium: 8.5 mg/dL — ABNORMAL LOW (ref 8.9–10.3)
Chloride: 104 mmol/L (ref 98–111)
Creatinine, Ser: 0.97 mg/dL (ref 0.44–1.00)
GFR, Estimated: >60 mL/min (ref >60)
Glucose, Bld: 128 mg/dL — ABNORMAL HIGH (ref 70–99)
Potassium: 4 mmol/L (ref 3.5–5.1)
Sodium: 139 mmol/L (ref 135–145)

## 2023-09-26 LAB — CBC
HCT: 36.3 % (ref 36.0–46.0)
Hemoglobin: 11.8 g/dL — ABNORMAL LOW (ref 12.0–15.0)
MCH: 30.4 pg (ref 26.0–34.0)
MCHC: 32.5 g/dL (ref 30.0–36.0)
MCV: 93.6 fL (ref 80.0–100.0)
Platelets: 152 10*3/uL (ref 150–400)
RBC: 3.88 MIL/uL (ref 3.87–5.11)
RDW: 13 % (ref 11.5–15.5)
WBC: 7.8 10*3/uL (ref 4.0–10.5)
nRBC: 0 % (ref 0.0–0.2)

## 2023-09-26 MED ORDER — METHOCARBAMOL 500 MG PO TABS: 500.0000 mg | ORAL_TABLET | Freq: Four times a day (QID) | ORAL | 0 refills | Status: DC | PRN

## 2023-09-26 MED ORDER — OXYCODONE HCL 5 MG PO TABS: 5.0000 mg | ORAL_TABLET | Freq: Four times a day (QID) | ORAL | 0 refills | Status: DC | PRN

## 2023-09-26 MED ORDER — ASPIRIN 81 MG PO CHEW
81.0000 mg | CHEWABLE_TABLET | Freq: Every day | ORAL | 0 refills | Status: DC
Start: 2023-09-26 — End: 2023-10-08

## 2023-09-26 NOTE — Discharge Planning (Signed)
 Patient alert. IV access removed. Discharge teaching given to patient and daughter Lorie Phenix. Patient and daughter verbalized understanding of teaching including medications. Discharge summary placed in discharge packet and put with patient belongings. Patient will be transported home via personal vehicle with her daughter.

## 2023-09-26 NOTE — Care Management Obs Status (Signed)
 MEDICARE OBSERVATION STATUS NOTIFICATION   Patient Details  Name: Audrey Craig MRN: [id number] Date of Birth: 02/18/1951   Medicare Observation Status Notification Given:  Yes    Lorri Frederick, LCSW 09/26/2023, 11:18 AM

## 2023-09-26 NOTE — TOC Transition Note (Addendum)
 Transition of Care [facility]) - Discharge Note   Patient Details  Name: Audrey Craig MRN: [id number] Date of Birth: Dec 10, 1950  [facility] CM/SW Contact:  Epifanio Lesches, RN Phone Number: 09/26/2023, 1:00 PM   Clinical Narrative:    Patient will DC to: home Anticipated DC date: 09/26/2023 Family notified:yes Transport by: car          - S/P R THA 3/4 Per MD patient ready for DC today.. RN, patient, patient's  daughter, and [facility] ( prearranged by provider's office) aware of DC.  Pt without DME needs. Has RW @ home. Declined BSC. Daughter to provide transportation home. Post hospital f/u noted on AVS. Pt without RX med concerns. Pt to pick up meds from local pharmaacy.  RNCM will sign off for now as intervention is no longer needed. Please consult [place] again if new needs arise.    Final next level of care: Home w Home Health Services Barriers to Discharge: No Barriers Identified   Patient Goals and CMS Choice            Discharge Placement                       Discharge Plan and Services Additional resources added to the After Visit Summary for                            HH Arranged: PT, OT HH Agency: [facility] Date HH Agency Contacted: 09/26/23 Time HH Agency Contacted: [phone]    Social Drivers of Health (SDOH) Interventions SDOH Screenings   Food Insecurity: No Food Insecurity (09/26/2023)  Housing: Low Risk  (09/26/2023)  Transportation Needs: No Transportation Needs (09/26/2023)  Utilities: Not At Risk (09/26/2023)  Social Connections: Socially Isolated (09/26/2023)  Tobacco Use: Medium Risk (09/25/2023)     Readmission Risk Interventions     No data to display

## 2023-09-26 NOTE — Progress Notes (Addendum)
 Physical Therapy Treatment Patient Details Name: Audrey Craig MRN: [id number] DOB: 1951-07-24 Today's Date: 09/26/2023   History of Present Illness 73 y.o. female presents to [facility] hospital on 09/25/2023 for elective R THA. PMH includes HLD, CAD, ataxic gait with abnormal brain MRI.    PT Comments  Pt received in supine after premedication for pain, pt reporting eager to work on OOB mobility so she can DC, pt surprised by increased c/o R hip pain although it was explained to her previous session that this would be the case as nerve block wears off. Pt needing greatly increased assist to perform bed mobility and transfers from hospital bed (pt will have adjustable bed initially) due to perseveration on pain and guarding. PTA reviews HEP, safety with car transfers, use of gait belt as leg lifter and for transfer/gait safety, benefits of frequent mobility, safe use of RW, and use of BSC if pt agreeable; pt defers BSC at end of session, case mgmt notified. Increased time needed to initiate/perform all tasks due to pt [facility] and some slow processing. Pt continues to benefit from PT services to progress toward functional mobility goals; pt eager to DC after PT session, RN notified, per family member in room they will have +2 physical assist where she will be staying initially.     If plan is discharge home, recommend the following: A little help with walking and/or transfers;A lot of help with bathing/dressing/bathroom;Assistance with cooking/housework;Assist for transportation;Help with stairs or ramp for entrance   Can travel by private vehicle        Equipment Recommendations  BSC/3in1    Recommendations for Other Services       Precautions / Restrictions Precautions Precautions: Fall Recall of Precautions/Restrictions: Intact Precaution/Restrictions Comments: direct anterior THA Restrictions Weight Bearing Restrictions Per Provider Order: Yes RLE Weight Bearing Per Provider Order: Weight bearing as  tolerated     Mobility  Bed Mobility Overal bed mobility: Needs Assistance Bed Mobility: Supine to Sit     Supine to sit: Mod assist, +2 for physical assistance, HOB elevated, Used rails     General bed mobility comments: Increased assist due to pt c/o R hip pain and guarding/impulsively leaning backward while attempting to mobilize to L EOB. Pt family member in room states she will have elevating HOB and rails available at home to assist with OOB mobility.    Transfers Overall transfer level: Needs assistance Equipment used: Rolling walker (2 wheels) Transfers: Sit to/from Stand Sit to Stand: Min assist, Mod assist, +2 safety/equipment, From elevated surface           General transfer comment: Cues for safety prior to standing and to push from mattress with LUE and not to pull on front of RW near button which collapses RW.    Ambulation/Gait Ambulation/Gait assistance: Contact guard assist, +2 safety/equipment Gait Distance (Feet): 80 Feet Assistive device: Rolling walker (2 wheels) Gait Pattern/deviations: Step-to pattern, Step-through pattern, Decreased stride length, Decreased step length - right, Decreased step length - left Gait velocity: reduced   Pre-gait activities: standing hip flexion x10 prior to trial General Gait Details: Pt needs frequent cues for closer proximity to RW, to stay wtihin RW esp while turning, and step-to vs step-through pattern depending on pain level. Pt denies nausea or dizziness while ambulating.   Stairs Stairs:  (pt will not have any STE family member's home, and will have HHPT services prior to return to her home)  Wheelchair Mobility     Tilt Bed    Modified Rankin (Stroke Patients Only)       Balance Overall balance assessment: Needs assistance Sitting-balance support: No upper extremity supported, Feet supported Sitting balance-Leahy Scale: Poor Sitting balance - Comments: initial posterior lean sitting EOB  due to pain/guarding which improves over time/with cues, pt needs trunk support first couple of minutes to prevent posterior loss of balance.   Standing balance support: Bilateral upper extremity supported, Reliant on assistive device for balance Standing balance-Leahy Scale: Poor Standing balance comment: posterior lean initially but improves with pre-gait standing tasks and cues                            Communication Communication Communication: Impaired Factors Affecting Communication: Hearing impaired (pt has hearing aides but does not wear them. Must read lips to hear PTA during this session)  Cognition Arousal: Alert Behavior During Therapy: WFL for tasks assessed/performed   PT - Cognitive impairments: No apparent impairments                       PT - Cognition Comments: HoH, pt internally distracted due to pain. Slow processing at times and pt states "I feel drunk" (due to pain meds ~ 2 hours prior) Following commands: Intact      Cueing Cueing Techniques: Verbal cues, Gestural cues, Visual cues, Tactile cues  Exercises Other Exercises Other Exercises: PTA provides education on surgical hip exercise packet Other Exercises: supine BLE AROM: ankle pumps, quad sets, cues for L heel slides but limited ROM and compliance due to pain + HoH status; hip abduction x5 reps with AA cues for use of gait belt vs family member assist. Other Exercises: standing BLE AROM: hip flexion x10 reps ea    General Comments General comments (skin integrity, edema, etc.): BP 104/61 (75) supine prior to OOB HR 76 bpm; BP 115/89 (98) HR 79 bpm sitting EOB initially, pt states "I feel drunk" but likely due to pain meds as BP stable; Knee-high TED hose donned throughout session.      Pertinent Vitals/Pain Pain Assessment Pain Assessment: PAINAD Faces Pain Scale: Hurts even more Breathing: occasional labored breathing, short period of hyperventilation Negative Vocalization:  occasional moan/groan, low speech, negative/disapproving quality Facial Expression: facial grimacing Body Language: tense, distressed pacing, fidgeting Consolability: distracted or reassured by voice/touch PAINAD Score: 6 Pain Location: R hip after premedication ~2 hours prior to session Pain Descriptors / Indicators: Burning, Grimacing, Operative site guarding, Numbness, Sharp Pain Intervention(s): Limited activity within patient's tolerance, Monitored during session, Premedicated before session, Repositioned, Ice applied    Home Living                          Prior Function            PT Goals (current goals can now be found in the care plan section) Acute Rehab PT Goals Patient Stated Goal: to return to independence, reduce pain PT Goal Formulation: With patient/family Time For Goal Achievement: 09/29/23 Progress towards PT goals: Progressing toward goals    Frequency    7X/week      PT Plan      Co-evaluation              AM-PAC PT "6 Clicks" Mobility   Outcome Measure  Help needed turning from your back to your side while in a flat bed  without using bedrails?: A Little Help needed moving from lying on your back to sitting on the side of a flat bed without using bedrails?: A Lot (due to pain) Help needed moving to and from a bed to a chair (including a wheelchair)?: A Little Help needed standing up from a chair using your arms (e.g., wheelchair or bedside chair)?: A Lot (due to [facility]) Help needed to walk in hospital room?: A Little Help needed climbing 3-5 steps with a railing? : A Lot 6 Click Score: 15    End of Session Equipment Utilized During Treatment: Gait belt Activity Tolerance: Patient tolerated treatment well (limited HEP completion due to [facility] + pain but family agreeable to reinforce with her) Patient left: with call bell/phone within reach;in chair;with chair alarm set;with family/visitor present Nurse Communication: Mobility  status PT Visit Diagnosis: Other abnormalities of gait and mobility (R26.89);Muscle weakness (generalized) (M62.81);Pain Pain - Right/Left: Right Pain - part of body: Hip     Time: 4098-1191 PT Time Calculation (min) (ACUTE ONLY): 38 min  Charges:    $Gait Training: 8-22 mins $Therapeutic Exercise: 8-22 mins $Therapeutic Activity: 8-22 mins PT General Charges $$ ACUTE PT VISIT: 1 Visit                     Audrey Mccalister P., PTA Acute Rehabilitation Services Secure Chat Preferred 9a-5:30pm Office: [phone]    Dorathy Kinsman [facility] 09/26/2023, 11:08 AM

## 2023-09-26 NOTE — Discharge Instructions (Signed)

## 2023-09-26 NOTE — Progress Notes (Signed)
 Subjective: 1 Day Post-Op Procedure(s) (LRB): RIGHT TOTAL HIP ARTHROPLASTY ANTERIOR APPROACH (Right) Patient reports pain as moderate.  She is currently sitting up at the bedside.  She said she was stiff last night.  She is hard of hearing.  Family is at the bedside as well.  She is very motivated.  Objective: Vital signs in last 24 hours: Temp:  [97.9 F (36.6 C)-99.8 F (37.7 C)] 98.6 F (37 C) (03/05 0736) Pulse Rate:  [63-91] 81 (03/05 0736) Resp:  [11-23] 17 (03/05 0736) BP: (98-152)/(49-82) 105/49 (03/05 0736) SpO2:  [89 %-98 %] 90 % (03/05 0736) Weight:  [95.7 kg] 95.7 kg (03/04 0825)  Intake/Output from previous day: 03/04 0701 - 03/05 0700 In: 1100 [I.V.:900; IV Piggyback:200] Out: 1350 [Urine:1300; Blood:50] Intake/Output this shift: No intake/output data recorded.  Recent Labs    09/26/23 0620  HGB 11.8*   Recent Labs    09/26/23 0620  WBC 7.8  RBC 3.88  HCT 36.3  PLT 152   Recent Labs    09/26/23 0620  NA 139  K 4.0  CL 104  CO2 26  BUN 10  CREATININE 0.97  GLUCOSE 128*  CALCIUM 8.5*   No results for input(s): "LABPT", "INR" in the last 72 hours.  Sensation intact distally Intact pulses distally Dorsiflexion/Plantar flexion intact Incision: dressing C/D/I   Assessment/Plan: 1 Day Post-Op Procedure(s) (LRB): RIGHT TOTAL HIP ARTHROPLASTY ANTERIOR APPROACH (Right) Up with therapy Discharge home with home health      Kathryne Hitch 09/26/2023, 7:45 AM

## 2023-09-26 NOTE — Discharge Summary (Signed)
 Patient ID: Audrey Craig MRN: [id number] DOB/AGE: Apr 08, 1951 73 y.o.  Admit date: 09/25/2023 Discharge date: 09/26/2023  Admission Diagnoses:  Principal Problem:   Unilateral primary osteoarthritis, right hip Active Problems:   Status post total replacement of right hip   Discharge Diagnoses:  Same  Past Medical History:  Diagnosis Date   Anxiety    Arthritis    COPD (chronic obstructive pulmonary disease) (HCC)    Coronary atherosclerosis of native coronary artery    PCTA obtuse marginal 5/99 - previous [facility] patient   COVID    Depression    Dyspnea    Fibromyalgia    Hearing loss    History of hiatal hernia    History of kidney stones    Hyperlipidemia    Hypertension    NSTEMI (non-ST elevated myocardial infarction) (HCC)    1999   Vision abnormalities     Surgeries: Procedure(s): RIGHT TOTAL HIP ARTHROPLASTY ANTERIOR APPROACH on 09/25/2023   Consultants:   Discharged Condition: Improved  Hospital Course: Audrey Craig is an 73 y.o. female who was admitted 09/25/2023 for operative treatment ofUnilateral primary osteoarthritis, right hip. Patient has severe unremitting pain that affects sleep, daily activities, and work/hobbies. After pre-op clearance the patient was taken to the operating room on 09/25/2023 and underwent  Procedure(s): RIGHT TOTAL HIP ARTHROPLASTY ANTERIOR APPROACH.    Patient was given perioperative antibiotics:  Anti-infectives (From admission, onward)    Start     Dose/Rate Route Frequency Ordered Stop   09/25/23 1730  ceFAZolin (ANCEF) IVPB 2g/100 mL premix        2 g 200 mL/hr over 30 Minutes Intravenous Every 6 hours 09/25/23 1440 09/25/23 2239   09/25/23 0845  ceFAZolin (ANCEF) IVPB 2g/100 mL premix        2 g 200 mL/hr over 30 Minutes Intravenous On call to O.R. 09/25/23 2952 09/25/23 1105   09/25/23 0838  ceFAZolin (ANCEF) 2-4 GM/100ML-% IVPB       Note to Pharmacy: Darrick Huntsman: cabinet override      09/25/23 0838 09/25/23 1105         Patient was given sequential compression devices, early ambulation, and chemoprophylaxis to prevent DVT.  Patient benefited maximally from hospital stay and there were no complications.    Recent vital signs: Patient Vitals for the past 24 hrs:  BP Temp Temp src Pulse Resp SpO2  09/26/23 0900 115/89 -- -- -- -- --  09/26/23 0736 (!) 105/49 98.6 F (37 C) Oral 81 17 90 %  09/26/23 0507 (!) 140/76 99.8 F (37.7 C) Oral 90 18 90 %  09/25/23 2355 134/73 99.8 F (37.7 C) Oral 91 16 92 %  09/25/23 1950 129/60 98.6 F (37 C) Oral 89 16 93 %  09/25/23 1834 (!) 136/56 99.6 F (37.6 C) Oral 86 16 (!) 89 %  09/25/23 1443 (!) 143/82 98.3 F (36.8 C) -- 78 16 93 %  09/25/23 1400 (!) 152/67 98 F (36.7 C) -- 83 18 96 %     Recent laboratory studies:  Recent Labs    09/26/23 0620  WBC 7.8  HGB 11.8*  HCT 36.3  PLT 152  NA 139  K 4.0  CL 104  CO2 26  BUN 10  CREATININE 0.97  GLUCOSE 128*  CALCIUM 8.5*     Discharge Medications:   Allergies as of 09/26/2023       Reactions   Tape Other (See Comments)   Blisters  Medication List     TAKE these medications    acetaminophen 500 MG tablet Commonly known as: TYLENOL Take 1,000 mg by mouth in the morning, at noon, and at bedtime.   albuterol 108 (90 Base) MCG/ACT inhaler Commonly known as: VENTOLIN HFA Inhale 2 puffs into the lungs every 6 (six) hours as needed for wheezing or shortness of breath.   aspirin 81 MG chewable tablet Chew 1 tablet (81 mg total) by mouth daily.   atorvastatin 40 MG tablet Commonly known as: LIPITOR Take 40 mg by mouth daily.   Breztri Aerosphere 160-9-4.8 MCG/ACT Aero Generic drug: Budeson-Glycopyrrol-Formoterol Inhale 2 puffs into the lungs daily.   calcium carbonate 1500 (600 Ca) MG Tabs tablet Commonly known as: OSCAL Take 600 mg of elemental calcium by mouth daily.   clopidogrel 75 MG tablet Commonly known as: PLAVIX Take 75 mg by mouth daily.   Cranberry 500  MG Caps Take 500 mg by mouth at bedtime.   DULoxetine 60 MG capsule Commonly known as: CYMBALTA Take 60 mg by mouth 2 (two) times daily.   ergocalciferol 1.25 MG (50000 UT) capsule Commonly known as: VITAMIN D2 Take 50,000 Units by mouth once a week.   fish oil-omega-3 fatty acids 1000 MG capsule Take 3 g by mouth daily.   gabapentin 300 MG capsule Commonly known as: NEURONTIN Take 300 mg by mouth 3 (three) times daily.   lisinopril-hydrochlorothiazide 10-12.5 MG tablet Commonly known as: ZESTORETIC Take 1 tablet by mouth daily.   loratadine 10 MG tablet Commonly known as: CLARITIN Take 10 mg by mouth daily.   methocarbamol 500 MG tablet Commonly known as: ROBAXIN Take 1 tablet (500 mg total) by mouth every 6 (six) hours as needed for muscle spasms.   metoprolol succinate 25 MG 24 hr tablet Commonly known as: Toprol XL Take 1 tablet (25 mg total) by mouth daily. What changed: when to take this   nitroGLYCERIN 0.4 MG SL tablet Commonly known as: NITROSTAT Place 1 tablet (0.4 mg total) under the tongue every 5 (five) minutes as needed. Up to 3 doses. If no relief after 3rd dose, proceed to ED   oxyCODONE 5 MG immediate release tablet Commonly known as: Oxy IR/ROXICODONE Take 1-2 tablets (5-10 mg total) by mouth every 6 (six) hours as needed for moderate pain (pain score 4-6) (pain score 4-6). No more than 6 tablets per day.   polycarbophil 625 MG tablet Commonly known as: FIBERCON Take 625 mg by mouth at bedtime.   SENIOR MULTIVITAMIN PLUS PO Take 1 tablet by mouth daily.               Durable Medical Equipment  (From admission, onward)           Start     Ordered   09/25/23 1441  DME 3 n 1  Once        09/25/23 1440   09/25/23 1441  DME Walker rolling  Once       Question Answer Comment  Walker: With 5 Inch Wheels   Patient needs a walker to treat with the following condition Status post total replacement of right hip      09/25/23 1440             Diagnostic Studies: DG Pelvis Portable Result Date: 09/25/2023 CLINICAL DATA:  Status post right hip replacement. EXAM: PORTABLE PELVIS 1-2 VIEWS COMPARISON:  None Available. FINDINGS: Right hip arthroplasty in expected alignment. No periprosthetic lucency or fracture. Recent postsurgical change includes  air and edema in the soft tissues. IMPRESSION: Right hip arthroplasty without immediate postoperative complication. Electronically Signed   By: Narda Rutherford M.D.   On: 09/25/2023 15:42   DG HIP UNILAT WITH PELVIS 1V RIGHT Result Date: 09/25/2023 CLINICAL DATA:  Elective surgery. EXAM: DG HIP (WITH OR WITHOUT PELVIS) 1V RIGHT COMPARISON:  None Available. FINDINGS: Three fluoroscopic spot views of the pelvis and right hip obtained in the operating room. Images during hip arthroplasty. Fluoroscopy time 20 seconds. Dose 3.67 mGy. IMPRESSION: Procedural fluoroscopy during right hip arthroplasty. Electronically Signed   By: Narda Rutherford M.D.   On: 09/25/2023 15:41   DG C-Arm 1-60 Min-No Report Result Date: 09/25/2023 Fluoroscopy was utilized by the requesting physician.  No radiographic interpretation.    Disposition: Discharge disposition: 01-Home or Self Care          Follow-up Information     Kathryne Hitch, MD Follow up in 2 week(s).   Specialty: Orthopedic Surgery Contact information: [address] [place] [address] [phone]         Toma Deiters, MD Follow up.   Specialty: Internal Medicine Contact information: [address] [place] [address] [phone]         [facility] Follow up.   [facility] start of care within 48 hours post discharge                 Signed: Kathryne Hitch 09/26/2023, 1:54 PM

## 2023-09-27 ENCOUNTER — Telehealth: Payer: Self-pay | Admitting: *Deleted

## 2023-09-27 DIAGNOSIS — I10 Essential (primary) hypertension: Secondary | ICD-10-CM | POA: Diagnosis not present

## 2023-09-27 DIAGNOSIS — Z96641 Presence of right artificial hip joint: Secondary | ICD-10-CM | POA: Diagnosis not present

## 2023-09-27 DIAGNOSIS — Z7902 Long term (current) use of antithrombotics/antiplatelets: Secondary | ICD-10-CM | POA: Diagnosis not present

## 2023-09-27 DIAGNOSIS — Z87891 Personal history of nicotine dependence: Secondary | ICD-10-CM | POA: Diagnosis not present

## 2023-09-27 DIAGNOSIS — E782 Mixed hyperlipidemia: Secondary | ICD-10-CM | POA: Diagnosis not present

## 2023-09-27 DIAGNOSIS — Z471 Aftercare following joint replacement surgery: Secondary | ICD-10-CM | POA: Diagnosis not present

## 2023-09-27 DIAGNOSIS — M797 Fibromyalgia: Secondary | ICD-10-CM | POA: Diagnosis not present

## 2023-09-27 DIAGNOSIS — I2510 Atherosclerotic heart disease of native coronary artery without angina pectoris: Secondary | ICD-10-CM | POA: Diagnosis not present

## 2023-09-27 DIAGNOSIS — I252 Old myocardial infarction: Secondary | ICD-10-CM | POA: Diagnosis not present

## 2023-09-27 DIAGNOSIS — J449 Chronic obstructive pulmonary disease, unspecified: Secondary | ICD-10-CM | POA: Diagnosis not present

## 2023-09-27 DIAGNOSIS — Z7982 Long term (current) use of aspirin: Secondary | ICD-10-CM | POA: Diagnosis not present

## 2023-09-27 NOTE — Telephone Encounter (Signed)
 Discharge call to patient and spoke with her sister, who is taking care of her. Patient extremely HOH as well. Asked about 81 mg aspirin daily prescribed along with Plavix. I explained our conversation and the sister states she'll probably continue this until she is up moving more. I agreed. Just an FYI.

## 2023-10-03 DIAGNOSIS — I2510 Atherosclerotic heart disease of native coronary artery without angina pectoris: Secondary | ICD-10-CM | POA: Diagnosis not present

## 2023-10-03 DIAGNOSIS — E782 Mixed hyperlipidemia: Secondary | ICD-10-CM | POA: Diagnosis not present

## 2023-10-03 DIAGNOSIS — I10 Essential (primary) hypertension: Secondary | ICD-10-CM | POA: Diagnosis not present

## 2023-10-03 DIAGNOSIS — J449 Chronic obstructive pulmonary disease, unspecified: Secondary | ICD-10-CM | POA: Diagnosis not present

## 2023-10-03 DIAGNOSIS — Z7902 Long term (current) use of antithrombotics/antiplatelets: Secondary | ICD-10-CM | POA: Diagnosis not present

## 2023-10-03 DIAGNOSIS — Z7982 Long term (current) use of aspirin: Secondary | ICD-10-CM | POA: Diagnosis not present

## 2023-10-03 DIAGNOSIS — Z87891 Personal history of nicotine dependence: Secondary | ICD-10-CM | POA: Diagnosis not present

## 2023-10-03 DIAGNOSIS — Z471 Aftercare following joint replacement surgery: Secondary | ICD-10-CM | POA: Diagnosis not present

## 2023-10-03 DIAGNOSIS — M797 Fibromyalgia: Secondary | ICD-10-CM | POA: Diagnosis not present

## 2023-10-03 DIAGNOSIS — Z96641 Presence of right artificial hip joint: Secondary | ICD-10-CM | POA: Diagnosis not present

## 2023-10-03 DIAGNOSIS — I252 Old myocardial infarction: Secondary | ICD-10-CM | POA: Diagnosis not present

## 2023-10-05 DIAGNOSIS — I252 Old myocardial infarction: Secondary | ICD-10-CM | POA: Diagnosis not present

## 2023-10-05 DIAGNOSIS — M797 Fibromyalgia: Secondary | ICD-10-CM | POA: Diagnosis not present

## 2023-10-05 DIAGNOSIS — I2510 Atherosclerotic heart disease of native coronary artery without angina pectoris: Secondary | ICD-10-CM | POA: Diagnosis not present

## 2023-10-05 DIAGNOSIS — Z7982 Long term (current) use of aspirin: Secondary | ICD-10-CM | POA: Diagnosis not present

## 2023-10-05 DIAGNOSIS — Z7902 Long term (current) use of antithrombotics/antiplatelets: Secondary | ICD-10-CM | POA: Diagnosis not present

## 2023-10-05 DIAGNOSIS — I10 Essential (primary) hypertension: Secondary | ICD-10-CM | POA: Diagnosis not present

## 2023-10-05 DIAGNOSIS — E782 Mixed hyperlipidemia: Secondary | ICD-10-CM | POA: Diagnosis not present

## 2023-10-05 DIAGNOSIS — J449 Chronic obstructive pulmonary disease, unspecified: Secondary | ICD-10-CM | POA: Diagnosis not present

## 2023-10-05 DIAGNOSIS — Z87891 Personal history of nicotine dependence: Secondary | ICD-10-CM | POA: Diagnosis not present

## 2023-10-05 DIAGNOSIS — Z471 Aftercare following joint replacement surgery: Secondary | ICD-10-CM | POA: Diagnosis not present

## 2023-10-05 DIAGNOSIS — Z96641 Presence of right artificial hip joint: Secondary | ICD-10-CM | POA: Diagnosis not present

## 2023-10-08 ENCOUNTER — Ambulatory Visit (INDEPENDENT_AMBULATORY_CARE_PROVIDER_SITE_OTHER): Payer: Medicare Other | Admitting: Orthopaedic Surgery

## 2023-10-08 ENCOUNTER — Encounter: Payer: Self-pay | Admitting: Orthopaedic Surgery

## 2023-10-08 DIAGNOSIS — Z96641 Presence of right artificial hip joint: Secondary | ICD-10-CM

## 2023-10-08 MED ORDER — TIZANIDINE HCL 4 MG PO TABS
4.0000 mg | ORAL_TABLET | Freq: Three times a day (TID) | ORAL | 0 refills | Status: DC | PRN
Start: 2023-10-08 — End: 2023-12-27

## 2023-10-08 NOTE — Progress Notes (Signed)
 The patient is here today for first postoperative visit status post a right total hip replacement.  She is ambulating with a walker and doing well reports good range of motion and strength.  Therapy has been working with her and nothing that they have been over doing her exercises.  She is having some soreness in her thigh.  Overall though she is doing well.  She is on Plavix and was taking a baby aspirin once a day which she can stop now.  She actually stopped that about a week ago.  Her right hip incision looks good.  The staples are been removed and Steri-Strips applied.  She tolerated including her hip the range of motion.  Giving the spasms she is having ongoing to send in some Zanaflex instead of methocarbamol to see if this will help.  I will have her slow down her activities and I would like to see her back in a month to see how she is doing overall.  No x-rays are needed.

## 2023-10-09 DIAGNOSIS — Z87891 Personal history of nicotine dependence: Secondary | ICD-10-CM | POA: Diagnosis not present

## 2023-10-09 DIAGNOSIS — M797 Fibromyalgia: Secondary | ICD-10-CM | POA: Diagnosis not present

## 2023-10-09 DIAGNOSIS — I252 Old myocardial infarction: Secondary | ICD-10-CM | POA: Diagnosis not present

## 2023-10-09 DIAGNOSIS — Z471 Aftercare following joint replacement surgery: Secondary | ICD-10-CM | POA: Diagnosis not present

## 2023-10-09 DIAGNOSIS — E782 Mixed hyperlipidemia: Secondary | ICD-10-CM | POA: Diagnosis not present

## 2023-10-09 DIAGNOSIS — Z96641 Presence of right artificial hip joint: Secondary | ICD-10-CM | POA: Diagnosis not present

## 2023-10-09 DIAGNOSIS — I10 Essential (primary) hypertension: Secondary | ICD-10-CM | POA: Diagnosis not present

## 2023-10-09 DIAGNOSIS — Z7902 Long term (current) use of antithrombotics/antiplatelets: Secondary | ICD-10-CM | POA: Diagnosis not present

## 2023-10-09 DIAGNOSIS — J449 Chronic obstructive pulmonary disease, unspecified: Secondary | ICD-10-CM | POA: Diagnosis not present

## 2023-10-09 DIAGNOSIS — Z7982 Long term (current) use of aspirin: Secondary | ICD-10-CM | POA: Diagnosis not present

## 2023-10-09 DIAGNOSIS — I2510 Atherosclerotic heart disease of native coronary artery without angina pectoris: Secondary | ICD-10-CM | POA: Diagnosis not present

## 2023-10-11 DIAGNOSIS — J449 Chronic obstructive pulmonary disease, unspecified: Secondary | ICD-10-CM | POA: Diagnosis not present

## 2023-10-11 DIAGNOSIS — I10 Essential (primary) hypertension: Secondary | ICD-10-CM | POA: Diagnosis not present

## 2023-10-11 DIAGNOSIS — Z96641 Presence of right artificial hip joint: Secondary | ICD-10-CM | POA: Diagnosis not present

## 2023-10-11 DIAGNOSIS — M797 Fibromyalgia: Secondary | ICD-10-CM | POA: Diagnosis not present

## 2023-10-11 DIAGNOSIS — I2510 Atherosclerotic heart disease of native coronary artery without angina pectoris: Secondary | ICD-10-CM | POA: Diagnosis not present

## 2023-10-11 DIAGNOSIS — Z471 Aftercare following joint replacement surgery: Secondary | ICD-10-CM | POA: Diagnosis not present

## 2023-10-11 DIAGNOSIS — Z7982 Long term (current) use of aspirin: Secondary | ICD-10-CM | POA: Diagnosis not present

## 2023-10-11 DIAGNOSIS — I252 Old myocardial infarction: Secondary | ICD-10-CM | POA: Diagnosis not present

## 2023-10-11 DIAGNOSIS — Z7902 Long term (current) use of antithrombotics/antiplatelets: Secondary | ICD-10-CM | POA: Diagnosis not present

## 2023-10-11 DIAGNOSIS — Z87891 Personal history of nicotine dependence: Secondary | ICD-10-CM | POA: Diagnosis not present

## 2023-10-11 DIAGNOSIS — E782 Mixed hyperlipidemia: Secondary | ICD-10-CM | POA: Diagnosis not present

## 2023-10-18 DIAGNOSIS — I2510 Atherosclerotic heart disease of native coronary artery without angina pectoris: Secondary | ICD-10-CM | POA: Diagnosis not present

## 2023-10-18 DIAGNOSIS — Z87891 Personal history of nicotine dependence: Secondary | ICD-10-CM | POA: Diagnosis not present

## 2023-10-18 DIAGNOSIS — I252 Old myocardial infarction: Secondary | ICD-10-CM | POA: Diagnosis not present

## 2023-10-18 DIAGNOSIS — Z7902 Long term (current) use of antithrombotics/antiplatelets: Secondary | ICD-10-CM | POA: Diagnosis not present

## 2023-10-18 DIAGNOSIS — Z7982 Long term (current) use of aspirin: Secondary | ICD-10-CM | POA: Diagnosis not present

## 2023-10-18 DIAGNOSIS — J449 Chronic obstructive pulmonary disease, unspecified: Secondary | ICD-10-CM | POA: Diagnosis not present

## 2023-10-18 DIAGNOSIS — Z471 Aftercare following joint replacement surgery: Secondary | ICD-10-CM | POA: Diagnosis not present

## 2023-10-18 DIAGNOSIS — M797 Fibromyalgia: Secondary | ICD-10-CM | POA: Diagnosis not present

## 2023-10-18 DIAGNOSIS — E782 Mixed hyperlipidemia: Secondary | ICD-10-CM | POA: Diagnosis not present

## 2023-10-18 DIAGNOSIS — I10 Essential (primary) hypertension: Secondary | ICD-10-CM | POA: Diagnosis not present

## 2023-10-18 DIAGNOSIS — Z96641 Presence of right artificial hip joint: Secondary | ICD-10-CM | POA: Diagnosis not present

## 2023-10-23 DIAGNOSIS — Z7902 Long term (current) use of antithrombotics/antiplatelets: Secondary | ICD-10-CM | POA: Diagnosis not present

## 2023-10-23 DIAGNOSIS — I252 Old myocardial infarction: Secondary | ICD-10-CM | POA: Diagnosis not present

## 2023-10-23 DIAGNOSIS — Z471 Aftercare following joint replacement surgery: Secondary | ICD-10-CM | POA: Diagnosis not present

## 2023-10-23 DIAGNOSIS — I10 Essential (primary) hypertension: Secondary | ICD-10-CM | POA: Diagnosis not present

## 2023-10-23 DIAGNOSIS — Z87891 Personal history of nicotine dependence: Secondary | ICD-10-CM | POA: Diagnosis not present

## 2023-10-23 DIAGNOSIS — I2510 Atherosclerotic heart disease of native coronary artery without angina pectoris: Secondary | ICD-10-CM | POA: Diagnosis not present

## 2023-10-23 DIAGNOSIS — M797 Fibromyalgia: Secondary | ICD-10-CM | POA: Diagnosis not present

## 2023-10-23 DIAGNOSIS — E782 Mixed hyperlipidemia: Secondary | ICD-10-CM | POA: Diagnosis not present

## 2023-10-23 DIAGNOSIS — Z96641 Presence of right artificial hip joint: Secondary | ICD-10-CM | POA: Diagnosis not present

## 2023-10-23 DIAGNOSIS — J449 Chronic obstructive pulmonary disease, unspecified: Secondary | ICD-10-CM | POA: Diagnosis not present

## 2023-10-23 DIAGNOSIS — Z7982 Long term (current) use of aspirin: Secondary | ICD-10-CM | POA: Diagnosis not present

## 2023-10-30 DIAGNOSIS — Z7982 Long term (current) use of aspirin: Secondary | ICD-10-CM | POA: Diagnosis not present

## 2023-10-30 DIAGNOSIS — Z7902 Long term (current) use of antithrombotics/antiplatelets: Secondary | ICD-10-CM | POA: Diagnosis not present

## 2023-10-30 DIAGNOSIS — J449 Chronic obstructive pulmonary disease, unspecified: Secondary | ICD-10-CM | POA: Diagnosis not present

## 2023-10-30 DIAGNOSIS — I252 Old myocardial infarction: Secondary | ICD-10-CM | POA: Diagnosis not present

## 2023-10-30 DIAGNOSIS — Z87891 Personal history of nicotine dependence: Secondary | ICD-10-CM | POA: Diagnosis not present

## 2023-10-30 DIAGNOSIS — Z471 Aftercare following joint replacement surgery: Secondary | ICD-10-CM | POA: Diagnosis not present

## 2023-10-30 DIAGNOSIS — Z96641 Presence of right artificial hip joint: Secondary | ICD-10-CM | POA: Diagnosis not present

## 2023-10-30 DIAGNOSIS — I10 Essential (primary) hypertension: Secondary | ICD-10-CM | POA: Diagnosis not present

## 2023-10-30 DIAGNOSIS — M797 Fibromyalgia: Secondary | ICD-10-CM | POA: Diagnosis not present

## 2023-10-30 DIAGNOSIS — E782 Mixed hyperlipidemia: Secondary | ICD-10-CM | POA: Diagnosis not present

## 2023-10-30 DIAGNOSIS — I2510 Atherosclerotic heart disease of native coronary artery without angina pectoris: Secondary | ICD-10-CM | POA: Diagnosis not present

## 2023-11-05 ENCOUNTER — Encounter: Payer: Self-pay | Admitting: Orthopaedic Surgery

## 2023-11-05 ENCOUNTER — Ambulatory Visit: Admitting: Orthopaedic Surgery

## 2023-11-05 DIAGNOSIS — Z96641 Presence of right artificial hip joint: Secondary | ICD-10-CM

## 2023-11-05 NOTE — Progress Notes (Signed)
 The patient is a 73 year old female who is now 6 weeks status post her right total hip arthroplasty.  She says she is doing great and has no pain.  She still uses a walker when she is out and about because she is afraid of falling given that she had had a lot of falling before.  She does drive and she does not use any assistive device in her house.  Her right operative hip moves smoothly and fluidly.  There is no blocks to rotation with the hip exam and she is doing well overall.  From my standpoint we do not need to see her back for 6 months unless she is having any issues.  If there are issues before then they know to reach out to us .  If not we will see her at that 31-month visit with a standing AP pelvis and lateral of her right hip.

## 2023-12-24 DIAGNOSIS — J301 Allergic rhinitis due to pollen: Secondary | ICD-10-CM | POA: Diagnosis not present

## 2023-12-24 DIAGNOSIS — K219 Gastro-esophageal reflux disease without esophagitis: Secondary | ICD-10-CM | POA: Diagnosis not present

## 2023-12-24 DIAGNOSIS — I10 Essential (primary) hypertension: Secondary | ICD-10-CM | POA: Diagnosis not present

## 2023-12-24 DIAGNOSIS — N182 Chronic kidney disease, stage 2 (mild): Secondary | ICD-10-CM | POA: Diagnosis not present

## 2023-12-24 DIAGNOSIS — J449 Chronic obstructive pulmonary disease, unspecified: Secondary | ICD-10-CM | POA: Diagnosis not present

## 2023-12-27 ENCOUNTER — Ambulatory Visit: Payer: Medicare Other | Attending: Cardiology | Admitting: Cardiology

## 2023-12-27 ENCOUNTER — Encounter: Payer: Self-pay | Admitting: Cardiology

## 2023-12-27 VITALS — BP 130/66 | HR 82 | Ht 68.0 in | Wt 215.0 lb

## 2023-12-27 DIAGNOSIS — I25119 Atherosclerotic heart disease of native coronary artery with unspecified angina pectoris: Secondary | ICD-10-CM

## 2023-12-27 DIAGNOSIS — I10 Essential (primary) hypertension: Secondary | ICD-10-CM

## 2023-12-27 DIAGNOSIS — E782 Mixed hyperlipidemia: Secondary | ICD-10-CM | POA: Diagnosis not present

## 2023-12-27 NOTE — Progress Notes (Signed)
    Cardiology Office Note  Date: 12/27/2023   ID: Audrey Craig, DOB Dec 15, 1950, MRN [id number]  History of Present Illness: Audrey Craig is a 74 y.o. female last seen in November 2024.  She is here for a routine visit.  Reports no angina or nitroglycerin  use in the interim, stable NYHA class II dyspnea.  We went over her medications.  She states that she has easy bruising on Plavix , we discussed switching to aspirin  81 mg daily if she would like.  Otherwise cardiac regimen has been stable and her lipids have been well-controlled, LDL 54 in February.  Physical Exam: VS:  BP 130/66   Pulse 82   Ht 5\' 8"  (1.727 m)   Wt 215 lb (97.5 kg)   SpO2 94%   BMI 32.69 kg/m , BMI Body mass index is 32.69 kg/m.  Wt Readings from Last 3 Encounters:  12/27/23 215 lb (97.5 kg)  09/25/23 211 lb (95.7 kg)  09/19/23 215 lb (97.5 kg)    General: Patient appears comfortable at rest. HEENT: Conjunctiva and lids normal. Neck: Supple, no elevated JVP or carotid bruits. Lungs: Clear to auscultation, nonlabored breathing at rest. Cardiac: Regular rate and rhythm, no S3 or significant systolic murmur. Extremities: No pitting edema.  ECG:  An ECG dated 06/20/2023 was personally reviewed today and demonstrated:  Sinus rhythm with low voltage, decreased R wave progression.  Labwork: February 2025: Cholesterol 124, triglycerides 177, HDL 40, LDL 54 09/26/2023: BUN 10; Creatinine, Ser 0.97; Hemoglobin 11.8; Platelets 152; Potassium 4.0; Sodium 139   Other Studies Reviewed Today:  No interval cardiac testing for review today.  Assessment and Plan:  1.  CAD status post angioplasty of the obtuse marginal in 1999.  Follow-up Lexiscan  Myoview  in 2019 was negative for ischemia with normal LVEF.  She continues to do well without angina or interval nitroglycerin  use.  We discussed switching from Plavix  to aspirin  81 mg daily given easy bruising.  She otherwise continues on Lipitor 40 mg daily.   2.  Mixed  hyperlipidemia.  LDL 54 in February.  Continue Lipitor 40 mg daily.   3.  Primary hypertension.  Continue Zestoretic 10/12.5 mg daily and Toprol -XL 25 mg daily.  Disposition:  Follow up 6 months.  Signed, Gerard Knight, M.D., F.A.C.C. [facility]

## 2023-12-27 NOTE — Patient Instructions (Signed)
 Medication Instructions:  Your physician has recommended you make the following change in your medication:  Stop clopidogrel  (plavix ) Start aspirin  81 mg daily Continue all other medications as prescribed  Labwork: none  Testing/Procedures: none  Follow-Up: Your physician recommends that you schedule a follow-up appointment in: 6 months  Any Other Special Instructions Will Be Listed Below (If Applicable).  If you need a refill on your cardiac medications before your next appointment, please call your pharmacy.

## 2024-03-06 DIAGNOSIS — J0191 Acute recurrent sinusitis, unspecified: Secondary | ICD-10-CM | POA: Diagnosis not present

## 2024-03-31 DIAGNOSIS — K219 Gastro-esophageal reflux disease without esophagitis: Secondary | ICD-10-CM | POA: Diagnosis not present

## 2024-03-31 DIAGNOSIS — N182 Chronic kidney disease, stage 2 (mild): Secondary | ICD-10-CM | POA: Diagnosis not present

## 2024-03-31 DIAGNOSIS — I10 Essential (primary) hypertension: Secondary | ICD-10-CM | POA: Diagnosis not present

## 2024-03-31 DIAGNOSIS — J440 Chronic obstructive pulmonary disease with acute lower respiratory infection: Secondary | ICD-10-CM | POA: Diagnosis not present

## 2024-03-31 DIAGNOSIS — J0191 Acute recurrent sinusitis, unspecified: Secondary | ICD-10-CM | POA: Diagnosis not present

## 2024-05-07 ENCOUNTER — Ambulatory Visit: Admitting: Orthopaedic Surgery

## 2024-05-12 ENCOUNTER — Other Ambulatory Visit

## 2024-05-12 ENCOUNTER — Encounter: Payer: Self-pay | Admitting: Orthopaedic Surgery

## 2024-05-12 ENCOUNTER — Ambulatory Visit: Admitting: Orthopaedic Surgery

## 2024-05-12 DIAGNOSIS — Z96641 Presence of right artificial hip joint: Secondary | ICD-10-CM

## 2024-05-12 NOTE — Progress Notes (Signed)
  The patient is now 7 months status post a right total hip arthroplasty to treat significant right hip pain and arthritis.  She is 73 years old and very active.  She has the hip is doing very well.  She has good range of motion and strength and just a little bit of numbness.  She is walking without any assistive device.  She says she is able to cross her legs easier as well and put her shoes and socks on easier.  Her right operative hip moves smoothly and fluidly.  Her left native hip also moves smoothly and fluidly.  Her leg lengths are equal.  Standing AP pelvis and lateral right hip shows a well-seated right total hip arthroplasty with no complicating features.  The left hip joint space is well-maintained.  At this point follow-up for her right hip can be as needed.  If she does develop any issues at all she knows to not hesitate to reach out to us .

## 2024-05-26 ENCOUNTER — Encounter: Payer: Self-pay | Admitting: Radiology
